# Patient Record
Sex: Female | Born: 1945 | Race: White | Hispanic: No | Marital: Married | Smoking: Former smoker
Health system: Southern US, Community
[De-identification: ages and names within clinical notes are randomized; demographics above are authoritative.]

## PROBLEM LIST (undated history)

## (undated) DIAGNOSIS — I350 Nonrheumatic aortic (valve) stenosis: Secondary | ICD-10-CM

## (undated) DIAGNOSIS — K219 Gastro-esophageal reflux disease without esophagitis: Secondary | ICD-10-CM

## (undated) DIAGNOSIS — H269 Unspecified cataract: Secondary | ICD-10-CM

## (undated) DIAGNOSIS — M1990 Unspecified osteoarthritis, unspecified site: Secondary | ICD-10-CM

## (undated) DIAGNOSIS — R011 Cardiac murmur, unspecified: Secondary | ICD-10-CM

## (undated) DIAGNOSIS — Z923 Personal history of irradiation: Secondary | ICD-10-CM

## (undated) DIAGNOSIS — M549 Dorsalgia, unspecified: Secondary | ICD-10-CM

## (undated) DIAGNOSIS — Z9289 Personal history of other medical treatment: Secondary | ICD-10-CM

## (undated) DIAGNOSIS — F32A Depression, unspecified: Secondary | ICD-10-CM

## (undated) DIAGNOSIS — K5790 Diverticulosis of intestine, part unspecified, without perforation or abscess without bleeding: Secondary | ICD-10-CM

## (undated) DIAGNOSIS — R079 Chest pain, unspecified: Secondary | ICD-10-CM

## (undated) DIAGNOSIS — E669 Obesity, unspecified: Secondary | ICD-10-CM

## (undated) DIAGNOSIS — F329 Major depressive disorder, single episode, unspecified: Secondary | ICD-10-CM

## (undated) DIAGNOSIS — F419 Anxiety disorder, unspecified: Secondary | ICD-10-CM

## (undated) DIAGNOSIS — C55 Malignant neoplasm of uterus, part unspecified: Secondary | ICD-10-CM

## (undated) DIAGNOSIS — R55 Syncope and collapse: Secondary | ICD-10-CM

## (undated) DIAGNOSIS — D649 Anemia, unspecified: Secondary | ICD-10-CM

## (undated) DIAGNOSIS — R9431 Abnormal electrocardiogram [ECG] [EKG]: Secondary | ICD-10-CM

## (undated) HISTORY — DX: Major depressive disorder, single episode, unspecified: F32.9

## (undated) HISTORY — DX: Anemia, unspecified: D64.9

## (undated) HISTORY — DX: Diverticulosis of intestine, part unspecified, without perforation or abscess without bleeding: K57.90

## (undated) HISTORY — DX: Malignant neoplasm of uterus, part unspecified: C55

## (undated) HISTORY — DX: Unspecified osteoarthritis, unspecified site: M19.90

## (undated) HISTORY — DX: Obesity, unspecified: E66.9

## (undated) HISTORY — DX: Personal history of other medical treatment: Z92.89

## (undated) HISTORY — PX: PORT-A-CATH REMOVAL: SHX5289

## (undated) HISTORY — DX: Dorsalgia, unspecified: M54.9

## (undated) HISTORY — DX: Anxiety disorder, unspecified: F41.9

## (undated) HISTORY — DX: Personal history of irradiation: Z92.3

## (undated) HISTORY — DX: Depression, unspecified: F32.A

---

## 2008-08-07 ENCOUNTER — Emergency Department (HOSPITAL_COMMUNITY): Admission: EM | Admit: 2008-08-07 | Discharge: 2008-08-07 | Payer: Self-pay | Admitting: Emergency Medicine

## 2009-11-28 DIAGNOSIS — C55 Malignant neoplasm of uterus, part unspecified: Secondary | ICD-10-CM

## 2009-11-28 HISTORY — DX: Malignant neoplasm of uterus, part unspecified: C55

## 2010-06-09 ENCOUNTER — Ambulatory Visit: Payer: Self-pay | Admitting: Obstetrics & Gynecology

## 2010-06-09 ENCOUNTER — Ambulatory Visit: Payer: Self-pay | Admitting: Interventional Radiology

## 2010-06-09 ENCOUNTER — Ambulatory Visit (HOSPITAL_BASED_OUTPATIENT_CLINIC_OR_DEPARTMENT_OTHER): Admission: RE | Admit: 2010-06-09 | Discharge: 2010-06-09 | Payer: Self-pay | Admitting: Obstetrics & Gynecology

## 2010-06-09 ENCOUNTER — Ambulatory Visit: Payer: Self-pay | Admitting: Diagnostic Radiology

## 2010-06-09 LAB — CONVERTED CEMR LAB
ALT: 11 units/L (ref 0–35)
AST: 11 units/L (ref 0–37)
Creatinine, Ser: 0.78 mg/dL (ref 0.40–1.20)
HCT: 41.5 % (ref 36.0–46.0)
MCHC: 31.1 g/dL (ref 30.0–36.0)
MCV: 95.6 fL (ref 78.0–100.0)
Platelets: 246 10*3/uL (ref 150–400)
RDW: 15.9 % — ABNORMAL HIGH (ref 11.5–15.5)
Total Bilirubin: 0.4 mg/dL (ref 0.3–1.2)

## 2010-06-15 ENCOUNTER — Ambulatory Visit: Payer: Self-pay | Admitting: Family Medicine

## 2010-06-21 ENCOUNTER — Encounter: Admission: RE | Admit: 2010-06-21 | Discharge: 2010-06-21 | Payer: Self-pay | Admitting: Obstetrics & Gynecology

## 2010-06-24 ENCOUNTER — Ambulatory Visit: Payer: Self-pay | Admitting: Family Medicine

## 2010-06-24 DIAGNOSIS — C541 Malignant neoplasm of endometrium: Secondary | ICD-10-CM | POA: Insufficient documentation

## 2010-06-24 DIAGNOSIS — F341 Dysthymic disorder: Secondary | ICD-10-CM

## 2010-07-01 ENCOUNTER — Ambulatory Visit: Admission: RE | Admit: 2010-07-01 | Discharge: 2010-07-01 | Payer: Self-pay | Admitting: Gynecologic Oncology

## 2010-07-27 ENCOUNTER — Ambulatory Visit (HOSPITAL_COMMUNITY): Admission: RE | Admit: 2010-07-27 | Discharge: 2010-07-27 | Payer: Self-pay | Admitting: Obstetrics & Gynecology

## 2010-07-28 ENCOUNTER — Ambulatory Visit: Payer: Self-pay | Admitting: Oncology

## 2010-08-03 ENCOUNTER — Ambulatory Visit (HOSPITAL_COMMUNITY): Admission: RE | Admit: 2010-08-03 | Discharge: 2010-08-03 | Payer: Self-pay | Admitting: Gynecologic Oncology

## 2010-08-05 ENCOUNTER — Ambulatory Visit: Admission: RE | Admit: 2010-08-05 | Discharge: 2010-08-05 | Payer: Self-pay | Admitting: Gynecologic Oncology

## 2010-08-09 ENCOUNTER — Encounter: Payer: Self-pay | Admitting: Family Medicine

## 2010-08-09 LAB — CBC WITH DIFFERENTIAL/PLATELET
BASO%: 0.3 % (ref 0.0–2.0)
HCT: 35.8 % (ref 34.8–46.6)
MCHC: 32.8 g/dL (ref 31.5–36.0)
MONO#: 0.4 10*3/uL (ref 0.1–0.9)
NEUT#: 4.5 10*3/uL (ref 1.5–6.5)
RBC: 4.05 10*6/uL (ref 3.70–5.45)
WBC: 6.6 10*3/uL (ref 3.9–10.3)
lymph#: 1.5 10*3/uL (ref 0.9–3.3)

## 2010-08-09 LAB — COMPREHENSIVE METABOLIC PANEL
ALT: 11 U/L (ref 0–35)
Albumin: 3.9 g/dL (ref 3.5–5.2)
CO2: 25 mEq/L (ref 19–32)
Calcium: 8.7 mg/dL (ref 8.4–10.5)
Chloride: 107 mEq/L (ref 96–112)
Sodium: 142 mEq/L (ref 135–145)
Total Protein: 6.8 g/dL (ref 6.0–8.3)

## 2010-08-09 LAB — CA 125: CA 125: 12.2 U/mL (ref 0.0–30.2)

## 2010-08-12 ENCOUNTER — Ambulatory Visit: Payer: Self-pay | Admitting: Family Medicine

## 2010-08-12 DIAGNOSIS — M545 Low back pain: Secondary | ICD-10-CM

## 2010-08-20 ENCOUNTER — Encounter: Payer: Self-pay | Admitting: Family Medicine

## 2010-08-20 LAB — CBC WITH DIFFERENTIAL/PLATELET
BASO%: 0.4 % (ref 0.0–2.0)
EOS%: 5.3 % (ref 0.0–7.0)
MCH: 29 pg (ref 25.1–34.0)
MCHC: 32.4 g/dL (ref 31.5–36.0)
NEUT%: 52.4 % (ref 38.4–76.8)
RDW: 14.9 % — ABNORMAL HIGH (ref 11.2–14.5)
lymph#: 1.9 10*3/uL (ref 0.9–3.3)

## 2010-08-25 LAB — CBC WITH DIFFERENTIAL/PLATELET
BASO%: 0.6 % (ref 0.0–2.0)
Basophils Absolute: 0 10*3/uL (ref 0.0–0.1)
EOS%: 3.4 % (ref 0.0–7.0)
HGB: 11.3 g/dL — ABNORMAL LOW (ref 11.6–15.9)
MCH: 29.6 pg (ref 25.1–34.0)
NEUT#: 0.7 10*3/uL — ABNORMAL LOW (ref 1.5–6.5)
RDW: 14.8 % — ABNORMAL HIGH (ref 11.2–14.5)
lymph#: 1.5 10*3/uL (ref 0.9–3.3)

## 2010-08-27 ENCOUNTER — Ambulatory Visit: Payer: Self-pay | Admitting: Oncology

## 2010-08-27 LAB — CBC WITH DIFFERENTIAL/PLATELET
Basophils Absolute: 0 10*3/uL (ref 0.0–0.1)
Eosinophils Absolute: 0.2 10*3/uL (ref 0.0–0.5)
HGB: 10.8 g/dL — ABNORMAL LOW (ref 11.6–15.9)
MCV: 89.2 fL (ref 79.5–101.0)
MONO#: 0.8 10*3/uL (ref 0.1–0.9)
NEUT#: 4.2 10*3/uL (ref 1.5–6.5)
RDW: 15.3 % — ABNORMAL HIGH (ref 11.2–14.5)
lymph#: 2.4 10*3/uL (ref 0.9–3.3)

## 2010-08-30 LAB — CBC WITH DIFFERENTIAL/PLATELET
Basophils Absolute: 0 10*3/uL (ref 0.0–0.1)
EOS%: 2.9 % (ref 0.0–7.0)
Eosinophils Absolute: 0.1 10*3/uL (ref 0.0–0.5)
HGB: 11.2 g/dL — ABNORMAL LOW (ref 11.6–15.9)
MCH: 30.3 pg (ref 25.1–34.0)
NEUT#: 1.9 10*3/uL (ref 1.5–6.5)
RBC: 3.7 10*6/uL (ref 3.70–5.45)
RDW: 15.4 % — ABNORMAL HIGH (ref 11.2–14.5)
lymph#: 1.8 10*3/uL (ref 0.9–3.3)

## 2010-08-30 LAB — COMPREHENSIVE METABOLIC PANEL
AST: 12 U/L (ref 0–37)
Albumin: 3.8 g/dL (ref 3.5–5.2)
BUN: 10 mg/dL (ref 6–23)
Calcium: 8.9 mg/dL (ref 8.4–10.5)
Chloride: 106 mEq/L (ref 96–112)
Potassium: 4.4 mEq/L (ref 3.5–5.3)
Sodium: 142 mEq/L (ref 135–145)
Total Protein: 6.3 g/dL (ref 6.0–8.3)

## 2010-08-31 ENCOUNTER — Encounter: Payer: Self-pay | Admitting: Family Medicine

## 2010-09-03 LAB — CBC WITH DIFFERENTIAL/PLATELET
BASO%: 0 % (ref 0.0–2.0)
EOS%: 0 % (ref 0.0–7.0)
HGB: 11.7 g/dL (ref 11.6–15.9)
MCH: 29.3 pg (ref 25.1–34.0)
MCHC: 32.8 g/dL (ref 31.5–36.0)
MONO%: 0.4 % (ref 0.0–14.0)
RBC: 4 10*6/uL (ref 3.70–5.45)
RDW: 15.6 % — ABNORMAL HIGH (ref 11.2–14.5)
lymph#: 1 10*3/uL (ref 0.9–3.3)

## 2010-09-10 ENCOUNTER — Ambulatory Visit: Payer: Self-pay | Admitting: Family Medicine

## 2010-09-10 DIAGNOSIS — G4700 Insomnia, unspecified: Secondary | ICD-10-CM

## 2010-09-10 DIAGNOSIS — K219 Gastro-esophageal reflux disease without esophagitis: Secondary | ICD-10-CM

## 2010-09-13 ENCOUNTER — Telehealth: Payer: Self-pay | Admitting: Family Medicine

## 2010-09-14 ENCOUNTER — Encounter: Payer: Self-pay | Admitting: Family Medicine

## 2010-09-14 LAB — COMPREHENSIVE METABOLIC PANEL
AST: 13 U/L (ref 0–37)
Albumin: 3.6 g/dL (ref 3.5–5.2)
Alkaline Phosphatase: 65 U/L (ref 39–117)
BUN: 11 mg/dL (ref 6–23)
Calcium: 8.7 mg/dL (ref 8.4–10.5)
Chloride: 106 mEq/L (ref 96–112)
Glucose, Bld: 127 mg/dL — ABNORMAL HIGH (ref 70–99)
Potassium: 3.5 mEq/L (ref 3.5–5.3)
Sodium: 141 mEq/L (ref 135–145)
Total Protein: 5.9 g/dL — ABNORMAL LOW (ref 6.0–8.3)

## 2010-09-14 LAB — CBC WITH DIFFERENTIAL/PLATELET
Basophils Absolute: 0 10*3/uL (ref 0.0–0.1)
Eosinophils Absolute: 0 10*3/uL (ref 0.0–0.5)
HGB: 10.3 g/dL — ABNORMAL LOW (ref 11.6–15.9)
NEUT#: 1.1 10*3/uL — ABNORMAL LOW (ref 1.5–6.5)
RBC: 3.39 10*6/uL — ABNORMAL LOW (ref 3.70–5.45)
RDW: 15.8 % — ABNORMAL HIGH (ref 11.2–14.5)
WBC: 2.7 10*3/uL — ABNORMAL LOW (ref 3.9–10.3)
lymph#: 1.3 10*3/uL (ref 0.9–3.3)

## 2010-09-23 LAB — CBC WITH DIFFERENTIAL/PLATELET
Basophils Absolute: 0 10*3/uL (ref 0.0–0.1)
EOS%: 0.9 % (ref 0.0–7.0)
HGB: 10.3 g/dL — ABNORMAL LOW (ref 11.6–15.9)
MCH: 29.7 pg (ref 25.1–34.0)
MCV: 90.5 fL (ref 79.5–101.0)
MONO%: 9.2 % (ref 0.0–14.0)
NEUT#: 1.9 10*3/uL (ref 1.5–6.5)
RBC: 3.47 10*6/uL — ABNORMAL LOW (ref 3.70–5.45)
RDW: 17.7 % — ABNORMAL HIGH (ref 11.2–14.5)
lymph#: 2.2 10*3/uL (ref 0.9–3.3)
nRBC: 0 % (ref 0–0)

## 2010-09-24 ENCOUNTER — Ambulatory Visit: Payer: Self-pay | Admitting: Oncology

## 2010-10-01 ENCOUNTER — Encounter: Payer: Self-pay | Admitting: Family Medicine

## 2010-10-01 LAB — CBC WITH DIFFERENTIAL/PLATELET
BASO%: 0.3 % (ref 0.0–2.0)
Eosinophils Absolute: 0.1 10*3/uL (ref 0.0–0.5)
MONO#: 0.3 10*3/uL (ref 0.1–0.9)
NEUT#: 1 10*3/uL — ABNORMAL LOW (ref 1.5–6.5)
Platelets: 141 10*3/uL — ABNORMAL LOW (ref 145–400)
RBC: 3.42 10*6/uL — ABNORMAL LOW (ref 3.70–5.45)
RDW: 18.8 % — ABNORMAL HIGH (ref 11.2–14.5)
WBC: 3 10*3/uL — ABNORMAL LOW (ref 3.9–10.3)
lymph#: 1.7 10*3/uL (ref 0.9–3.3)

## 2010-10-01 LAB — BASIC METABOLIC PANEL
CO2: 26 mEq/L (ref 19–32)
Chloride: 102 mEq/L (ref 96–112)
Glucose, Bld: 113 mg/dL — ABNORMAL HIGH (ref 70–99)
Potassium: 3.9 mEq/L (ref 3.5–5.3)
Sodium: 137 mEq/L (ref 135–145)

## 2010-10-04 ENCOUNTER — Ambulatory Visit: Payer: Self-pay | Admitting: Family Medicine

## 2010-10-11 ENCOUNTER — Ambulatory Visit (HOSPITAL_COMMUNITY): Admission: RE | Admit: 2010-10-11 | Discharge: 2010-10-11 | Payer: Self-pay | Admitting: Oncology

## 2010-10-13 ENCOUNTER — Ambulatory Visit
Admission: RE | Admit: 2010-10-13 | Discharge: 2010-10-13 | Payer: Self-pay | Source: Home / Self Care | Admitting: Gynecologic Oncology

## 2010-10-13 LAB — CBC WITH DIFFERENTIAL/PLATELET
BASO%: 0.1 % (ref 0.0–2.0)
Basophils Absolute: 0 10*3/uL (ref 0.0–0.1)
EOS%: 0.7 % (ref 0.0–7.0)
HCT: 28 % — ABNORMAL LOW (ref 34.8–46.6)
HGB: 9.6 g/dL — ABNORMAL LOW (ref 11.6–15.9)
LYMPH%: 38.9 % (ref 14.0–49.7)
MCH: 31.7 pg (ref 25.1–34.0)
MCHC: 34.3 g/dL (ref 31.5–36.0)
NEUT%: 47.9 % (ref 38.4–76.8)
Platelets: 116 10*3/uL — ABNORMAL LOW (ref 145–400)

## 2010-10-13 LAB — COMPREHENSIVE METABOLIC PANEL
ALT: 12 U/L (ref 0–35)
BUN: 12 mg/dL (ref 6–23)
CO2: 25 mEq/L (ref 19–32)
Calcium: 8.6 mg/dL (ref 8.4–10.5)
Chloride: 105 mEq/L (ref 96–112)
Creatinine, Ser: 0.88 mg/dL (ref 0.40–1.20)
Total Bilirubin: 0.4 mg/dL (ref 0.3–1.2)

## 2010-10-15 ENCOUNTER — Telehealth: Payer: Self-pay | Admitting: Family Medicine

## 2010-10-28 HISTORY — PX: TOTAL ABDOMINAL HYSTERECTOMY: SHX209

## 2010-11-02 ENCOUNTER — Encounter: Payer: Self-pay | Admitting: Obstetrics & Gynecology

## 2010-11-02 ENCOUNTER — Ambulatory Visit (HOSPITAL_COMMUNITY)
Admission: RE | Admit: 2010-11-02 | Discharge: 2010-11-03 | Payer: Self-pay | Source: Home / Self Care | Attending: Obstetrics & Gynecology | Admitting: Obstetrics & Gynecology

## 2010-11-11 ENCOUNTER — Ambulatory Visit
Admission: RE | Admit: 2010-11-11 | Discharge: 2010-11-11 | Payer: Self-pay | Source: Home / Self Care | Attending: Gynecologic Oncology | Admitting: Gynecologic Oncology

## 2010-11-11 ENCOUNTER — Telehealth: Payer: Self-pay | Admitting: Family Medicine

## 2010-11-16 ENCOUNTER — Ambulatory Visit: Payer: Self-pay | Admitting: Oncology

## 2010-11-26 ENCOUNTER — Encounter: Payer: Self-pay | Admitting: Family Medicine

## 2010-11-26 LAB — CBC WITH DIFFERENTIAL/PLATELET
Basophils Absolute: 0 10*3/uL (ref 0.0–0.1)
Eosinophils Absolute: 0.1 10*3/uL (ref 0.0–0.5)
HCT: 32 % — ABNORMAL LOW (ref 34.8–46.6)
HGB: 10.6 g/dL — ABNORMAL LOW (ref 11.6–15.9)
LYMPH%: 34.9 % (ref 14.0–49.7)
MCV: 99.4 fL (ref 79.5–101.0)
MONO%: 6.1 % (ref 0.0–14.0)
NEUT#: 3.1 10*3/uL (ref 1.5–6.5)
Platelets: 200 10*3/uL (ref 145–400)

## 2010-11-26 LAB — COMPREHENSIVE METABOLIC PANEL
Albumin: 4.2 g/dL (ref 3.5–5.2)
Alkaline Phosphatase: 78 U/L (ref 39–117)
BUN: 17 mg/dL (ref 6–23)
CO2: 24 mEq/L (ref 19–32)
Glucose, Bld: 126 mg/dL — ABNORMAL HIGH (ref 70–99)
Total Bilirubin: 0.4 mg/dL (ref 0.3–1.2)

## 2010-11-28 HISTORY — PX: PORTACATH PLACEMENT: SHX2246

## 2010-12-06 ENCOUNTER — Ambulatory Visit (HOSPITAL_COMMUNITY)
Admission: RE | Admit: 2010-12-06 | Discharge: 2010-12-06 | Payer: Self-pay | Source: Home / Self Care | Attending: Oncology | Admitting: Oncology

## 2010-12-09 ENCOUNTER — Encounter: Payer: Self-pay | Admitting: Family Medicine

## 2010-12-09 ENCOUNTER — Ambulatory Visit
Admission: RE | Admit: 2010-12-09 | Discharge: 2010-12-09 | Payer: Self-pay | Source: Home / Self Care | Attending: Family Medicine | Admitting: Family Medicine

## 2010-12-22 ENCOUNTER — Ambulatory Visit (HOSPITAL_BASED_OUTPATIENT_CLINIC_OR_DEPARTMENT_OTHER): Payer: Medicare PPO | Admitting: Oncology

## 2010-12-24 ENCOUNTER — Encounter: Payer: Self-pay | Admitting: Family Medicine

## 2010-12-24 LAB — CBC WITH DIFFERENTIAL/PLATELET
Basophils Absolute: 0 10*3/uL (ref 0.0–0.1)
EOS%: 2.8 % (ref 0.0–7.0)
HGB: 9.8 g/dL — ABNORMAL LOW (ref 11.6–15.9)
MCH: 32.2 pg (ref 25.1–34.0)
MCV: 100 fL (ref 79.5–101.0)
MONO%: 5.8 % (ref 0.0–14.0)
NEUT%: 38.5 % (ref 38.4–76.8)
RDW: 14.5 % (ref 11.2–14.5)

## 2010-12-28 NOTE — Letter (Signed)
Summary: Depression Questionnaire  Depression Questionnaire   Imported By: Lanelle Bal 09/17/2010 16:25:11  _____________________________________________________________________  External Attachment:    Type:   Image     Comment:   External Document

## 2010-12-28 NOTE — Progress Notes (Signed)
Summary: meds   Phone Note Call from Patient   Caller: Patient Call For: Nani Gasser MD Summary of Call: pt called and states aciphex was $288.Pt states she has samples to last her untill the first of Nov. Did you want to write another rx or wait until the first of Nov because pt has another appt with you then Initial call taken by: Avon Gully CMA, Duncan Dull),  September 13, 2010 8:30 AM  Follow-up for Phone Call        Let just wait until next appt. Might have more samples I can give her by then or can talk about other options.  Follow-up by: Nani Gasser MD,  September 13, 2010 9:09 AM  Additional Follow-up for Phone Call Additional follow up Details #1::        called and left above info on pt's vm Additional Follow-up by: Avon Gully CMA, Duncan Dull),  September 13, 2010 10:59 AM

## 2010-12-28 NOTE — Letter (Signed)
Summary: Depression Questionnaire  Depression Questionnaire   Imported By: Lanelle Bal 08/25/2010 13:54:41  _____________________________________________________________________  External Attachment:    Type:   Image     Comment:   External Document

## 2010-12-28 NOTE — Letter (Signed)
Summary: [facility]  [facility]   Imported By: Lanelle Bal 10/25/2010 08:39:00  _____________________________________________________________________  External Attachment:    Type:   Image     Comment:   External Document

## 2010-12-28 NOTE — Letter (Signed)
Summary: [facility]  [facility]   Imported By: Sherian Rein 09/28/2010 15:12:59  _____________________________________________________________________  External Attachment:    Type:   Image     Comment:   External Document

## 2010-12-28 NOTE — Letter (Signed)
Summary: Depression Questionnaire  Depression Questionnaire   Imported By: Lanelle Bal 07/23/2010 12:03:21  _____________________________________________________________________  External Attachment:    Type:   Image     Comment:   External Document

## 2010-12-28 NOTE — Letter (Signed)
Summary: Depression Questionnaire  Depression Questionnaire   Imported By: Lanelle Bal 10/09/2010 11:07:45  _____________________________________________________________________  External Attachment:    Type:   Image     Comment:   External Document

## 2010-12-28 NOTE — Assessment & Plan Note (Signed)
Summary: 1 MONTH FU Mood, sleep, GERD   Vital Signs:  Patient profile:   65 year old female Height:      63.5 inches Weight:      269 pounds Pulse rate:   82 / minute BP sitting:   128 / 74  (right arm) Cuff size:   large  Vitals Entered By: Avon Gully CMA, Duncan Dull) (September 10, 2010 9:32 AM) CC: f/u mood,not sleeping well at night   CC:  f/u mood and not sleeping well at night.  History of Present Illness: f/u mood,not sleeping well at night. Only getting about 3 hours of sleep most nights. then the next day can't concentrate and feels achey all over.  Has been tearful daily.  Says her memory is not at good. She is on chemo for uterine cancer and has lost some weight but she is very excited about the weight loss. Her husband is really not very supportive of her.   Says the Aciphex Dr. Darrold Span gave her has really helped her reflux and nausea.    Current Medications (verified): 1)  Lorazepam 1 Mg Tabs (Lorazepam) .... Take 1/2 -1 Tanlet A Day As Needed 2)  Shower Chair .... Dx: Uterine Cancer, Lower Extremity Weakness, Low Back Pain 3)  Lift Chair .... Dx: Uterine Cancer, Lower Extremity Weakness, Low Back Pain. 4)  Prochlorperazine Maleate 10 Mg Tabs (Prochlorperazine Maleate) .... One Tablet By Mouht Every 4-6 Hours 5)  Oxycodone-Acetaminophen 5-325 Mg Tabs (Oxycodone-Acetaminophen) .... One Tablet By Mouth Every Six Hours 6)  Ondansetron 8 Mg Tbdp (Ondansetron) .... One Tablet By Mouth Every 24 Hours 7)  Dexamethasone 4 Mg Tabs (Dexamethasone) .... 5 Tablet 12 Hours Befor Chemo 8)  Senokot  S 1 Daily 9)  Multivitamins  Caps (Multiple Vitamin) .... Take One Tablet By Mouth Twice A Day 10)  Aciphex 20 Mg Tbec (Rabeprazole Sodium) .... One Tablet By Mouth Daily  Allergies (verified): No Known Drug Allergies  Comments:  Nurse/Medical Assistant: The patient's medications and allergies were reviewed with the patient and were updated in the Medication and Allergy  Lists. Avon Gully CMA, Duncan Dull) (September 10, 2010 9:38 AM)  Physical Exam  General:  Well-developed,well-nourished,in no acute distress; alert,appropriate and cooperative throughout examination Head:  Normocephalic and atraumatic without obvious abnormalities. No apparent alopecia or balding. Eyes:  No corneal or conjunctival inflammation noted. EOMI. Perrla.  Lungs:  Normal respiratory effort, chest expands symmetrically. Lungs are clear to auscultation, no crackles or wheezes. Heart:  Normal rate and regular rhythm. S1 and S2 normal without gallop, murmur, click, rub or other extra sounds.   Impression & Recommendations:  Problem # 1:  DEPRESSION, SITUATIONAL, ACUTE (ICD-300.4) Assessment Deteriorated Will start the fluoxetine dialy for better control. Can use the benzo with it as needed.  PHQ-9 score of 15 today. Tamera Punt not well controlled. She didnt do well on teh citalopram. Says she didn't liek how it made her feels but not able to give me a specific reason.  F/U in 3 weeks.   Problem # 2:  INSOMNIA (ICD-780.52) Assessment: New Discussed dx. Will start with Ambien and discussed potential SE like sedation etc. Call if not helping after one week.  Her updated medication list for this problem includes:    Ambien 10 Mg Tabs (Zolpidem tartrate) .Marland Kitchen... Take 1 tablet by mouth once a day at bedtime  Problem # 3:  GERD (ICD-530.81) I am happy this is work ing really well for her.  Rx sent. If denies may  have to try another PPI or if too expensive but she will let me know.  Her updated medication list for this problem includes:    Aciphex 20 Mg Tbec (Rabeprazole sodium) ..... One tablet by mouth daily  Complete Medication List: 1)  Lorazepam 1 Mg Tabs (Lorazepam) .... Take 1/2 -1 tanlet a day as needed 2)  Paediatric nurse  .... Dx: uterine cancer, lower extremity weakness, low back pain 3)  Lift Chair  .... Dx: uterine cancer, lower extremity weakness, low back pain. 4)   Prochlorperazine Maleate 10 Mg Tabs (Prochlorperazine maleate) .... One tablet by mouht every 4-6 hours 5)  Oxycodone-acetaminophen 5-325 Mg Tabs (Oxycodone-acetaminophen) .... One tablet by mouth every six hours 6)  Ondansetron 8 Mg Tbdp (Ondansetron) .... One tablet by mouth every 24 hours 7)  Dexamethasone 4 Mg Tabs (Dexamethasone) .... 5 tablet 12 hours befor chemo 8)  Senokot S 1 Daily  9)  Multivitamins Caps (Multiple vitamin) .... Take one tablet by mouth twice a day 10)  Aciphex 20 Mg Tbec (Rabeprazole sodium) .... One tablet by mouth daily 11)  Fluoxetine Hcl 20 Mg Tabs (Fluoxetine hcl) .... Start with half a tab once a day, then increase tow whole tab. 12)  Ambien 10 Mg Tabs (Zolpidem tartrate) .... Take 1 tablet by mouth once a day at bedtime  Patient Instructions: 1)  Please schedule a follow-up appointment in 3 weeks for mood and sleep. 2)  Start with half a tab of the ambien and increase to whole if needed 3)  Start half a tab of the fluoxetine.   Prescriptions: AMBIEN 10 MG TABS (ZOLPIDEM TARTRATE) Take 1 tablet by mouth once a day at bedtime  #30 x 0   Entered and Authorized by:   Nani Gasser MD   Signed by:   Nani Gasser MD on 09/10/2010   Method used:   Printed then faxed to ...       [address] [phone]* (retail)       [address] [place], [place]  [address]       Ph: [phone]       Fax: [phone]   RxID:   [id number] ACIPHEX 20 MG TBEC (RABEPRAZOLE SODIUM) one tablet by mouth daily  #30 x 2   Entered and Authorized by:   Nani Gasser MD   Signed by:   Nani Gasser MD on 09/10/2010   Method used:   Electronically to        [facility] [phone]* (retail)       [address] [place], [place]  [address]       Ph: [phone]       Fax: [phone]   RxID:   [id number] FLUOXETINE HCL 20 MG TABS (FLUOXETINE HCL) Start with half a tab once a day, then increase tow whole tab.  #30 x 1   Entered and Authorized by:    Nani Gasser MD   Signed by:   Nani Gasser MD on 09/10/2010   Method used:   Electronically to        [facility] [phone]* (retail)       [address] [place], [place]  [address]       Ph: [phone]       Fax: [phone]   RxID:   [id number]

## 2010-12-28 NOTE — Progress Notes (Signed)
Summary: FYI- surgery date  Phone Note Call from Patient Call back at Home Phone [phone]   Caller: Patient Call For: Nani Gasser MD Summary of Call: Pt calls and states that you wanted to know when Dr. Gerre Pebbles would be doing her surgery- it will be done on December 6th Initial call taken by: Kathlene November LPN,  October 15, 2010 9:09 AM

## 2010-12-28 NOTE — Assessment & Plan Note (Signed)
Summary: 3 week f/u depression   Vital Signs:  Patient profile:   65 year old female Height:      63.5 inches Weight:      266 pounds Pulse rate:   86 / minute BP sitting:   132 / 80  (right arm) Cuff size:   regular  Vitals Entered By: Avon Gully CMA, Duncan Dull) (October 04, 2010 8:55 AM) CC: f/u mood,pt feels better   CC:  f/u mood and pt feels better.  History of Present Illness: f/u mood,pt feels better. When went  up to a whole tab she noticed a inc in fatigue. Has been using her Ambien and says it has really helped her sleep. No Side effects.  No other SE of the fluoxetine.Says she realy feels it is helping and has improved her motivation. Has now finshed her chemo.  Has her CT scan in about 2 weeks and then has f/u with her surgeon. Has lost 18lb from the chemo and working with the nutritionist.    Allergies: No Known Drug Allergies  Physical Exam  General:  Well-developed,well-nourished,in no acute distress; alert,appropriate and cooperative throughout examination Lungs:  Normal respiratory effort, chest expands symmetrically. Lungs are clear to auscultation, no crackles or wheezes. Heart:  Normal rate and regular rhythm. S1 and S2 normal without gallop, murmur, click, rub or other extra sounds.   Impression & Recommendations:  Problem # 1:  DEPRESSION, SITUATIONAL, ACUTE (ICD-300.4) PHQ-9 score is 5.  Much improved form 15.  Tolerating well. Try moving to bedtime and see if that helps with th esedaion of the medication. F/U in 2 months. She is doing a great job with her diet.    Complete Medication List: 1)  Lorazepam 1 Mg Tabs (Lorazepam) .... Take 1/2 -1 tanlet a day as needed 2)  Paediatric nurse  .... Dx: uterine cancer, lower extremity weakness, low back pain 3)  Lift Chair  .... Dx: uterine cancer, lower extremity weakness, low back pain. 4)  Prochlorperazine Maleate 10 Mg Tabs (Prochlorperazine maleate) .... One tablet by mouht every 4-6 hours 5)   Oxycodone-acetaminophen 5-325 Mg Tabs (Oxycodone-acetaminophen) .... One tablet by mouth every six hours 6)  Ondansetron 8 Mg Tbdp (Ondansetron) .... One tablet by mouth every 24 hours 7)  Dexamethasone 4 Mg Tabs (Dexamethasone) .... 5 tablet 12 hours befor chemo 8)  Senokot S 1 Daily  9)  Multivitamins Caps (Multiple vitamin) .... Take one tablet by mouth twice a day 10)  Aciphex 20 Mg Tbec (Rabeprazole sodium) .... One tablet by mouth daily 11)  Fluoxetine Hcl 20 Mg Tabs (Fluoxetine hcl) .... Start with half a tab once a day, then increase tow whole tab. 12)  Ambien 10 Mg Tabs (Zolpidem tartrate) .... Take 1 tablet by mouth once a day at bedtime  Patient Instructions: 1)  Please schedule a follow-up appointment in 2 months for mood.  2)  Move the fluoxetine to bedtime for 4-5 days. If fatigue still not better can cut the pill in half.    Orders Added: 1)  Est. Patient Level III [[address]]

## 2010-12-28 NOTE — Letter (Signed)
Summary: [facility]  [facility]   Imported By: Lanelle Bal 09/02/2010 10:53:50  _____________________________________________________________________  External Attachment:    Type:   Image     Comment:   External Document

## 2010-12-28 NOTE — Letter (Signed)
[facility]  [facility]   Imported By: Lanelle Bal 10/01/2010 12:20:00  _____________________________________________________________________  External Attachment:    Type:   Image     Comment:   External Document

## 2010-12-28 NOTE — Assessment & Plan Note (Signed)
Summary: 1 mo. f/u depression   Vital Signs:  Patient profile:   65 year old female Height:      63.5 inches Weight:      282 pounds Pulse rate:   70 / minute BP sitting:   124 / 71  (right arm) Cuff size:   large  Vitals Entered By: Avon Gully CMA, Duncan Dull) (August 12, 2010 1:52 PM) CC: f/u depression   CC:  f/u depression.  History of Present Illness: Had PET scan that showed in the uterus but not metastasized.  Sart chemo tomorrow. Starting taxol.  Says her mood is fair. Has some bad days. Stopped the citalopram because mad her feel "weird". Not able to pinpoint anything specific. Using the xanx three times a day. Feels her husband is not being supportive. She does have a neighbor who is going to appts with her and this has been helpful.   Current Medications (verified): 1)  Lorazepam 1 Mg Tabs (Lorazepam) .... Take 1/2 -1 Tanlet A Day As Needed  Allergies (verified): No Known Drug Allergies  Comments:  Nurse/Medical Assistant: The patient's medications and allergies were reviewed with the patient and were updated in the Medication and Allergy Lists. Avon Gully CMA, Duncan Dull) (August 12, 2010 1:53 PM)  Physical Exam  General:  Well-developed,well-nourished,in no acute distress; alert,appropriate and cooperative throughout examination Lungs:  Normal respiratory effort, chest expands symmetrically. Lungs are clear to auscultation, no crackles or wheezes. Heart:  Normal rate and regular rhythm. S1 and S2 normal without gallop, murmur, click, rub or other extra sounds. Psych:  Oriented X3, memory intact for recent and remote, and normallyinteractive. Seems happpier today than last visit.    Impression & Recommendations:  Problem # 1:  DEPRESSION, SITUATIONAL, ACUTE (ICD-300.4) I really thinks she is coming to terms with he dx and is doing some better. I regret that her husband is not more supportive of her. I still recommen cousneling. PHQ - 9 score today is 4  (down from 13). She is not interested in counseling because of what her husband feels about it.  Has lost 5 lbs. Will continue the xanax but if needig more than 2 tabs daily then will need to try a second SSRI since didn't like the citalopram. She doesn't want to trya another SSRI at this point. F/U in 2 months.   Complete Medication List: 1)  Lorazepam 1 Mg Tabs (Lorazepam) .... Take 1/2 -1 tanlet a day as needed 2)  Paediatric nurse  .... Dx: uterine cancer, lower extremity weakness, low back pain 3)  Lift Chair  .... Dx: uterine cancer, lower extremity weakness, low back pain. Prescriptions: LIFT CHAIR Dx: Uterine cancer, lower extremity weakness, low back pain.  #1 x 0   Entered and Authorized by:   Nani Gasser MD   Signed by:   Nani Gasser MD on 08/12/2010   Method used:   Print then Give to Patient   RxID:   [id number] JYNWGN CHAIR Dx: Uterine cancer, lower extremity weakness, low back pain  #1 x 0   Entered and Authorized by:   Nani Gasser MD   Signed by:   Nani Gasser MD on 08/12/2010   Method used:   Print then Give to Patient   RxID:   [id number]
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
## 2010-12-28 NOTE — Letter (Signed)
Summary: [facility]  [facility]   Imported By: Lanelle Bal 09/10/2010 11:52:24  _____________________________________________________________________  External Attachment:    Type:   Image     Comment:   External Document

## 2010-12-30 NOTE — Assessment & Plan Note (Signed)
Summary: 2 mo f/u on mood   Vital Signs:  Patient profile:   65 year old female Height:      63.5 inches Weight:      261 pounds Pulse rate:   112 / minute BP sitting:   103 / 62  (right arm) Cuff size:   regular  Vitals Entered By: Avon Gully CMA, Duncan Dull) (December 09, 2010 9:26 AM) CC: f/u depression   CC:  f/u depression.  History of Present Illness: Did well with her surgery adn chemotherapy. Told the ovaries and tubes has nod sign of cancer.  They are planning on more radiation and chemotherapy.  Says overall she has done well.  still having difficlyt dealing with her husband. She feels that is more stressful than the cancer.  No SE from teh medication.   Current Medications (verified): 1)  Paediatric nurse .... Dx: Uterine Cancer, Lower Extremity Weakness, Low Back Pain 2)  Lift Chair .... Dx: Uterine Cancer, Lower Extremity Weakness, Low Back Pain. 3)  Prochlorperazine Maleate 10 Mg Tabs (Prochlorperazine Maleate) .... One Tablet By Mouht Every 4-6 Hours 4)  Oxycodone-Acetaminophen 5-325 Mg Tabs (Oxycodone-Acetaminophen) .... One Tablet By Mouth Every Six Hours 5)  Ondansetron 8 Mg Tbdp (Ondansetron) .... One Tablet By Mouth Every 24 Hours 6)  Dexamethasone 4 Mg Tabs (Dexamethasone) .... 5 Tablet 12 Hours Befor Chemo 7)  Senokot  S 1 Daily 8)  Multivitamins  Caps (Multiple Vitamin) .... Take One Tablet By Mouth Twice A Day 9)  Aciphex 20 Mg Tbec (Rabeprazole Sodium) .... One Tablet By Mouth Daily 10)  Fluoxetine Hcl 20 Mg Tabs (Fluoxetine Hcl) .... Start With Half A Tab Once A Day, Then Increase Tow Whole Tab. 11)  Ambien 10 Mg Tabs (Zolpidem Tartrate) .... Take 1 Tablet By Mouth Once A Day At Bedtime 12)  Ferrous Gluconate 324 (38 Fe) Mg Tabs (Ferrous Gluconate) .... Take One Tablet By Mouth Daily  Allergies (verified): No Known Drug Allergies  Comments:  Nurse/Medical Assistant: The patient's medications and allergies were reviewed with the patient and were updated  in the Medication and Allergy Lists. Avon Gully CMA, Duncan Dull) (December 09, 2010 9:27 AM)  Past History:  Past Surgical History: Complete hysterectomcy  Portacath.   Physical Exam  General:  Well-developed,well-nourished,in no acute distress; alert,appropriate and cooperative throughout examination Lungs:  Normal respiratory effort, chest expands symmetrically. Lungs are clear to auscultation, no crackles or wheezes. Heart:  Normal rate and regular rhythm. S1 and S2 normal without gallop, murmur, click, rub or other extra sounds. Psych:  Cognition and judgment appear intact. Alert and cooperative with normal attention span and concentration. No apparent delusions, illusions, hallucinations   Impression & Recommendations:  Problem # 1:  DEPRESSION, SITUATIONAL, ACUTE (ICD-300.4) Her mood is really well controlled. PHQ-9 score of 0. Will refill for 90 days.  F/U in 3- 4 months.   Complete Medication List: 1)  Paediatric nurse  .... Dx: uterine cancer, lower extremity weakness, low back pain 2)  Lift Chair  .... Dx: uterine cancer, lower extremity weakness, low back pain. 3)  Prochlorperazine Maleate 10 Mg Tabs (Prochlorperazine maleate) .... One tablet by mouht every 4-6 hours 4)  Oxycodone-acetaminophen 5-325 Mg Tabs (Oxycodone-acetaminophen) .... One tablet by mouth every six hours 5)  Ondansetron 8 Mg Tbdp (Ondansetron) .... One tablet by mouth every 24 hours 6)  Dexamethasone 4 Mg Tabs (Dexamethasone) .... 5 tablet 12 hours befor chemo 7)  Senokot S 1 Daily  8)  Multivitamins Caps (Multiple  vitamin) .... Take one tablet by mouth twice a day 9)  Aciphex 20 Mg Tbec (Rabeprazole sodium) .... One tablet by mouth daily 10)  Fluoxetine Hcl 20 Mg Tabs (Fluoxetine hcl) .... Take 1 tablet by mouth once a day 11)  Ambien 10 Mg Tabs (Zolpidem tartrate) .... Take 1 tablet by mouth once a day at bedtime 12)  Ferrous Gluconate 324 (38 Fe) Mg Tabs (Ferrous gluconate) .... Take one tablet by  mouth daily  Patient Instructions: 1)  Please schedule a follow-up appointment in 3-4 months for mood.  Prescriptions: FLUOXETINE HCL 20 MG TABS (FLUOXETINE HCL) Take 1 tablet by mouth once a day  #90 x 1   Entered and Authorized by:   Nani Gasser MD   Signed by:   Nani Gasser MD on 12/09/2010   Method used:   Electronically to        [facility] [phone]* (retail)       [address] [place], [place]  [address]       Ph: [phone]       Fax: [phone]   RxID:   [id number]    Orders Added: 1)  Est. Patient Level II [36644]   Immunization History:  Influenza Immunization History:    Influenza:  historical (11/30/2010)   Immunization History:  Influenza Immunization History:    Influenza:  Historical (11/30/2010)   Immunization History:  Influenza Immunization History:    Influenza:  historical (11/30/2010)

## 2010-12-30 NOTE — Progress Notes (Signed)
Summary: Check on pt.   ---- Converted from flag ---- ---- 11/10/2010 4:11 PM, Avon Gully CMA, (AAMA) wrote: 11/10/10 acm 4:11 ovaries ,tubes ,uteris cervix and one lymph node. Pt goes back tomorrow for check up.pt is having some difficulty moving around but is feeling fine under the circumstances  ---- 10/15/2010 9:32 AM, Nani Gasser MD wrote: Will you call and see how she dide with her surgery. ------------------------------

## 2010-12-30 NOTE — Letter (Signed)
Summary: [facility]  [facility]   Imported By: Lanelle Bal 12/23/2010 10:35:59  _____________________________________________________________________  External Attachment:    Type:   Image     Comment:   External Document

## 2010-12-30 NOTE — Letter (Signed)
Summary: Depression Questionnaire  Depression Questionnaire   Imported By: Lanelle Bal 12/22/2010 11:49:06  _____________________________________________________________________  External Attachment:    Type:   Image     Comment:   External Document

## 2010-12-31 ENCOUNTER — Encounter (HOSPITAL_BASED_OUTPATIENT_CLINIC_OR_DEPARTMENT_OTHER): Payer: Medicare PPO | Admitting: Oncology

## 2010-12-31 ENCOUNTER — Encounter: Payer: Self-pay | Admitting: Family Medicine

## 2010-12-31 DIAGNOSIS — C549 Malignant neoplasm of corpus uteri, unspecified: Secondary | ICD-10-CM

## 2010-12-31 DIAGNOSIS — Z23 Encounter for immunization: Secondary | ICD-10-CM

## 2010-12-31 DIAGNOSIS — Z5111 Encounter for antineoplastic chemotherapy: Secondary | ICD-10-CM

## 2010-12-31 DIAGNOSIS — Z5189 Encounter for other specified aftercare: Secondary | ICD-10-CM

## 2010-12-31 LAB — CBC WITH DIFFERENTIAL/PLATELET
BASO%: 0.3 % (ref 0.0–2.0)
Eosinophils Absolute: 0 10*3/uL (ref 0.0–0.5)
MCHC: 34 g/dL (ref 31.5–36.0)
MONO#: 0 10*3/uL — ABNORMAL LOW (ref 0.1–0.9)
NEUT#: 2.5 10*3/uL (ref 1.5–6.5)
RBC: 3.22 10*6/uL — ABNORMAL LOW (ref 3.70–5.45)
RDW: 15.1 % — ABNORMAL HIGH (ref 11.2–14.5)
WBC: 3.3 10*3/uL — ABNORMAL LOW (ref 3.9–10.3)
lymph#: 0.7 10*3/uL — ABNORMAL LOW (ref 0.9–3.3)

## 2011-01-01 ENCOUNTER — Encounter (HOSPITAL_BASED_OUTPATIENT_CLINIC_OR_DEPARTMENT_OTHER): Payer: Medicare PPO | Admitting: Oncology

## 2011-01-02 ENCOUNTER — Encounter: Payer: Medicare PPO | Admitting: Oncology

## 2011-01-02 DIAGNOSIS — C549 Malignant neoplasm of corpus uteri, unspecified: Secondary | ICD-10-CM

## 2011-01-03 ENCOUNTER — Encounter (HOSPITAL_BASED_OUTPATIENT_CLINIC_OR_DEPARTMENT_OTHER): Payer: Medicare PPO | Admitting: Oncology

## 2011-01-03 DIAGNOSIS — C549 Malignant neoplasm of corpus uteri, unspecified: Secondary | ICD-10-CM

## 2011-01-04 ENCOUNTER — Encounter (HOSPITAL_BASED_OUTPATIENT_CLINIC_OR_DEPARTMENT_OTHER): Payer: Medicare PPO | Admitting: Oncology

## 2011-01-04 DIAGNOSIS — C549 Malignant neoplasm of corpus uteri, unspecified: Secondary | ICD-10-CM

## 2011-01-17 ENCOUNTER — Encounter (HOSPITAL_BASED_OUTPATIENT_CLINIC_OR_DEPARTMENT_OTHER): Payer: Medicare PPO | Admitting: Oncology

## 2011-01-17 ENCOUNTER — Other Ambulatory Visit: Payer: Self-pay | Admitting: Oncology

## 2011-01-17 ENCOUNTER — Encounter: Payer: Self-pay | Admitting: Family Medicine

## 2011-01-17 DIAGNOSIS — Z5111 Encounter for antineoplastic chemotherapy: Secondary | ICD-10-CM

## 2011-01-17 DIAGNOSIS — C549 Malignant neoplasm of corpus uteri, unspecified: Secondary | ICD-10-CM

## 2011-01-17 DIAGNOSIS — Z5189 Encounter for other specified aftercare: Secondary | ICD-10-CM

## 2011-01-17 DIAGNOSIS — Z23 Encounter for immunization: Secondary | ICD-10-CM

## 2011-01-17 LAB — COMPREHENSIVE METABOLIC PANEL
ALT: 11 U/L (ref 0–35)
CO2: 28 mEq/L (ref 19–32)
Calcium: 8.8 mg/dL (ref 8.4–10.5)
Chloride: 105 mEq/L (ref 96–112)
Creatinine, Ser: 0.81 mg/dL (ref 0.40–1.20)
Glucose, Bld: 92 mg/dL (ref 70–99)
Total Protein: 6.5 g/dL (ref 6.0–8.3)

## 2011-01-17 LAB — CBC WITH DIFFERENTIAL/PLATELET
BASO%: 0.3 % (ref 0.0–2.0)
Eosinophils Absolute: 0.1 10*3/uL (ref 0.0–0.5)
HCT: 27.9 % — ABNORMAL LOW (ref 34.8–46.6)
HGB: 9.6 g/dL — ABNORMAL LOW (ref 11.6–15.9)
MCHC: 34.3 g/dL (ref 31.5–36.0)
MONO#: 0.4 10*3/uL (ref 0.1–0.9)
NEUT#: 2.1 10*3/uL (ref 1.5–6.5)
NEUT%: 47.1 % (ref 38.4–76.8)
Platelets: 87 10*3/uL — ABNORMAL LOW (ref 145–400)
WBC: 4.4 10*3/uL (ref 3.9–10.3)
lymph#: 1.8 10*3/uL (ref 0.9–3.3)

## 2011-01-25 NOTE — Letter (Signed)
Summary: [facility]  [facility]   Imported By: Maryln Gottron 01/17/2011 12:50:38  _____________________________________________________________________  External Attachment:    Type:   Image     Comment:   External Document

## 2011-01-26 ENCOUNTER — Encounter (HOSPITAL_BASED_OUTPATIENT_CLINIC_OR_DEPARTMENT_OTHER): Payer: Medicare PPO | Admitting: Oncology

## 2011-01-26 ENCOUNTER — Other Ambulatory Visit: Payer: Self-pay | Admitting: Oncology

## 2011-01-26 DIAGNOSIS — C549 Malignant neoplasm of corpus uteri, unspecified: Secondary | ICD-10-CM

## 2011-01-26 DIAGNOSIS — Z5111 Encounter for antineoplastic chemotherapy: Secondary | ICD-10-CM

## 2011-01-26 LAB — CBC WITH DIFFERENTIAL/PLATELET
Basophils Absolute: 0 10*3/uL (ref 0.0–0.1)
Eosinophils Absolute: 0.1 10*3/uL (ref 0.0–0.5)
HCT: 28.3 % — ABNORMAL LOW (ref 34.8–46.6)
HGB: 9.1 g/dL — ABNORMAL LOW (ref 11.6–15.9)
LYMPH%: 32.5 % (ref 14.0–49.7)
MCHC: 32.2 g/dL (ref 31.5–36.0)
MONO#: 0.5 10*3/uL (ref 0.1–0.9)
NEUT%: 57.9 % (ref 38.4–76.8)
Platelets: 117 10*3/uL — ABNORMAL LOW (ref 145–400)
WBC: 6.7 10*3/uL (ref 3.9–10.3)
lymph#: 2.2 10*3/uL (ref 0.9–3.3)

## 2011-01-27 ENCOUNTER — Encounter: Payer: Self-pay | Admitting: Family Medicine

## 2011-01-28 ENCOUNTER — Encounter (HOSPITAL_BASED_OUTPATIENT_CLINIC_OR_DEPARTMENT_OTHER): Payer: Medicare PPO | Admitting: Oncology

## 2011-01-28 DIAGNOSIS — Z5111 Encounter for antineoplastic chemotherapy: Secondary | ICD-10-CM

## 2011-01-29 ENCOUNTER — Encounter (HOSPITAL_BASED_OUTPATIENT_CLINIC_OR_DEPARTMENT_OTHER): Payer: Medicare PPO | Admitting: Oncology

## 2011-01-29 DIAGNOSIS — C549 Malignant neoplasm of corpus uteri, unspecified: Secondary | ICD-10-CM

## 2011-01-30 ENCOUNTER — Encounter: Payer: Medicare PPO | Admitting: Oncology

## 2011-01-30 DIAGNOSIS — C549 Malignant neoplasm of corpus uteri, unspecified: Secondary | ICD-10-CM

## 2011-01-31 ENCOUNTER — Encounter (HOSPITAL_BASED_OUTPATIENT_CLINIC_OR_DEPARTMENT_OTHER): Payer: Medicare PPO | Admitting: Oncology

## 2011-01-31 DIAGNOSIS — C549 Malignant neoplasm of corpus uteri, unspecified: Secondary | ICD-10-CM

## 2011-02-01 ENCOUNTER — Encounter (HOSPITAL_BASED_OUTPATIENT_CLINIC_OR_DEPARTMENT_OTHER): Payer: Medicare PPO | Admitting: Oncology

## 2011-02-01 DIAGNOSIS — C549 Malignant neoplasm of corpus uteri, unspecified: Secondary | ICD-10-CM

## 2011-02-07 ENCOUNTER — Encounter: Payer: Self-pay | Admitting: Family Medicine

## 2011-02-07 ENCOUNTER — Ambulatory Visit (INDEPENDENT_AMBULATORY_CARE_PROVIDER_SITE_OTHER): Payer: Medicare PPO | Admitting: Family Medicine

## 2011-02-07 ENCOUNTER — Telehealth: Payer: Self-pay | Admitting: Family Medicine

## 2011-02-07 ENCOUNTER — Other Ambulatory Visit: Payer: Self-pay | Admitting: Family Medicine

## 2011-02-07 ENCOUNTER — Ambulatory Visit (HOSPITAL_COMMUNITY)
Admission: RE | Admit: 2011-02-07 | Discharge: 2011-02-07 | Disposition: A | Discharge status: Home / Self Care | Payer: Medicare PPO | Source: Ambulatory Visit | Attending: Family Medicine | Admitting: Family Medicine

## 2011-02-07 DIAGNOSIS — I517 Cardiomegaly: Secondary | ICD-10-CM | POA: Insufficient documentation

## 2011-02-07 DIAGNOSIS — Z90710 Acquired absence of both cervix and uterus: Secondary | ICD-10-CM | POA: Insufficient documentation

## 2011-02-07 DIAGNOSIS — K5732 Diverticulitis of large intestine without perforation or abscess without bleeding: Secondary | ICD-10-CM | POA: Insufficient documentation

## 2011-02-07 DIAGNOSIS — K8020 Calculus of gallbladder without cholecystitis without obstruction: Secondary | ICD-10-CM | POA: Insufficient documentation

## 2011-02-07 DIAGNOSIS — K429 Umbilical hernia without obstruction or gangrene: Secondary | ICD-10-CM | POA: Insufficient documentation

## 2011-02-07 DIAGNOSIS — R1032 Left lower quadrant pain: Secondary | ICD-10-CM

## 2011-02-07 DIAGNOSIS — R1904 Left lower quadrant abdominal swelling, mass and lump: Secondary | ICD-10-CM

## 2011-02-07 DIAGNOSIS — N281 Cyst of kidney, acquired: Secondary | ICD-10-CM | POA: Insufficient documentation

## 2011-02-07 DIAGNOSIS — Z8542 Personal history of malignant neoplasm of other parts of uterus: Secondary | ICD-10-CM | POA: Insufficient documentation

## 2011-02-07 LAB — CONVERTED CEMR LAB
AST: 9 units/L (ref 0–37)
Albumin: 3.9 g/dL (ref 3.5–5.2)
Alkaline Phosphatase: 85 units/L (ref 39–117)
BUN: 17 mg/dL (ref 6–23)
Basophils Relative: 0 % (ref 0–1)
Calcium: 8.6 mg/dL (ref 8.4–10.5)
Chloride: 103 meq/L (ref 96–112)
Creatinine, Ser: 0.8 mg/dL (ref 0.40–1.20)
Eosinophils Absolute: 0 10*3/uL (ref 0.0–0.7)
Glucose, Bld: 138 mg/dL — ABNORMAL HIGH (ref 70–99)
Hemoglobin: 8.8 g/dL — ABNORMAL LOW (ref 12.0–15.0)
Lymphs Abs: 1.8 10*3/uL (ref 0.7–4.0)
MCHC: 33.6 g/dL (ref 30.0–36.0)
MCV: 97.8 fL (ref 78.0–100.0)
Monocytes Absolute: 0.1 10*3/uL (ref 0.1–1.0)
Monocytes Relative: 2 % — ABNORMAL LOW (ref 3–12)
Potassium: 3.7 meq/L (ref 3.5–5.3)
RBC: 2.68 M/uL — ABNORMAL LOW (ref 3.87–5.11)
WBC: 3.5 10*3/uL — ABNORMAL LOW (ref 4.0–10.5)

## 2011-02-07 MED ORDER — IOHEXOL 300 MG/ML  SOLN
100.0000 mL | Freq: Once | INTRAMUSCULAR | Status: AC | PRN
  Administered 2011-02-07: 100 mL via INTRAVENOUS

## 2011-02-08 LAB — BASIC METABOLIC PANEL
CO2: 27 mEq/L (ref 19–32)
GFR calc non Af Amer: >60 mL/min (ref >60)
Glucose, Bld: 107 mg/dL — ABNORMAL HIGH (ref 70–99)
Potassium: 3.9 mEq/L (ref 3.5–5.1)
Sodium: 139 mEq/L (ref 135–145)

## 2011-02-08 LAB — CBC
HCT: 25.6 % — ABNORMAL LOW (ref 36.0–46.0)
HCT: 32.6 % — ABNORMAL LOW (ref 36.0–46.0)
Hemoglobin: 8.3 g/dL — ABNORMAL LOW (ref 12.0–15.0)
MCH: 31.8 pg (ref 26.0–34.0)
MCHC: 32.4 g/dL (ref 30.0–36.0)
Platelets: 176 10*3/uL (ref 150–400)
RBC: 2.61 MIL/uL — ABNORMAL LOW (ref 3.87–5.11)
RDW: 21.5 % — ABNORMAL HIGH (ref 11.5–15.5)
WBC: 6 10*3/uL (ref 4.0–10.5)

## 2011-02-08 LAB — COMPREHENSIVE METABOLIC PANEL
ALT: 15 U/L (ref 0–35)
AST: 15 U/L (ref 0–37)
Albumin: 3.7 g/dL (ref 3.5–5.2)
Alkaline Phosphatase: 72 U/L (ref 39–117)
Glucose, Bld: 112 mg/dL — ABNORMAL HIGH (ref 70–99)
Potassium: 4.4 mEq/L (ref 3.5–5.1)
Sodium: 142 mEq/L (ref 135–145)
Total Protein: 7.3 g/dL (ref 6.0–8.3)

## 2011-02-08 LAB — DIFFERENTIAL
Basophils Relative: 0 % (ref 0–1)
Eosinophils Absolute: 0.1 10*3/uL (ref 0.0–0.7)
Monocytes Absolute: 0.4 10*3/uL (ref 0.1–1.0)
Neutro Abs: 3.5 10*3/uL (ref 1.7–7.7)

## 2011-02-08 LAB — SURGICAL PCR SCREEN: MRSA, PCR: NEGATIVE

## 2011-02-08 LAB — HEMOGLOBIN AND HEMATOCRIT, BLOOD: HCT: 25.5 % — ABNORMAL LOW (ref 36.0–46.0)

## 2011-02-08 NOTE — Letter (Signed)
Summary: [facility]  [facility]   Imported By: Lanelle Bal 02/04/2011 12:09:12  _____________________________________________________________________  External Attachment:    Type:   Image     Comment:   External Document

## 2011-02-11 LAB — CBC
Hemoglobin: 12.1 g/dL (ref 12.0–15.0)
MCH: 30.2 pg (ref 26.0–34.0)
RBC: 4.02 MIL/uL (ref 3.87–5.11)
WBC: 6.8 10*3/uL (ref 4.0–10.5)

## 2011-02-11 LAB — TYPE AND SCREEN: ABO/RH(D): O NEG

## 2011-02-11 LAB — COMPREHENSIVE METABOLIC PANEL
ALT: 13 U/L (ref 0–35)
AST: 13 U/L (ref 0–37)
Albumin: 3.6 g/dL (ref 3.5–5.2)
Alkaline Phosphatase: 66 U/L (ref 39–117)
CO2: 28 mEq/L (ref 19–32)
Chloride: 106 mEq/L (ref 96–112)
Creatinine, Ser: 0.81 mg/dL (ref 0.4–1.2)
GFR calc Af Amer: >60 mL/min (ref >60)
GFR calc non Af Amer: >60 mL/min (ref >60)
Potassium: 4.3 mEq/L (ref 3.5–5.1)
Total Bilirubin: 0.4 mg/dL (ref 0.3–1.2)

## 2011-02-11 LAB — DIFFERENTIAL
Basophils Absolute: 0 10*3/uL (ref 0.0–0.1)
Basophils Relative: 0 % (ref 0–1)
Eosinophils Absolute: 0.2 10*3/uL (ref 0.0–0.7)
Eosinophils Relative: 2 % (ref 0–5)
Lymphocytes Relative: 26 % (ref 12–46)
Monocytes Absolute: 0.5 10*3/uL (ref 0.1–1.0)

## 2011-02-15 NOTE — Progress Notes (Signed)
  Phone Note Outgoing Call   Summary of Call: CT scan shows acute diverticulitis with no abscess, pt tells me that she is already on antibiotics per Dr. Eppie Gibson so I did not call any in Initial call taken by: Etta Grandchild MD,  February 07, 2011 7:48 PM

## 2011-02-15 NOTE — Progress Notes (Signed)
Summary: needs a faxed order now  Phone Note From Other Clinic   Caller: Receptionist Summary of Call: Fanda from [facility] needs a Faxed order for her CT of abdomen and pelvis w/contrast faxed to [phone].Marland KitchenMarland KitchenPatient is there now. Thanks, Michaelle Copas  February 07, 2011 3:40 PM  Initial call taken by: Michaelle Copas,  February 07, 2011 3:40 PM

## 2011-02-15 NOTE — Assessment & Plan Note (Signed)
Summary: LLQ pain   Vital Signs:  Patient profile:   65 year old female Height:      63.5 inches Weight:      257 pounds Pulse rate:   90 / minute BP sitting:   120 / 69  (right arm) Cuff size:   regular  Vitals Entered By: Avon Gully CMA, Duncan Dull) (February 07, 2011 10:56 AM) CC: lower abd discomfort, "feels like its falling", aches on the left lower side started this am   CC:  lower abd discomfort, "feels like its falling", and aches on the left lower side started this am.  History of Present Illness: lower abd discomfort, "feels like its falling", aches on the left lower side started this am. Feels like something has droped in the middle of her pelvis. Having sharp pain on her left side.  Started around 1AM. No fever.  No nausea or vomiting.  Last 4-5 days has had dec BMs. NO dysuria. No other sxs.  Recenty hx of chemo etc for uterine Cancer.  No exacerbating sxs. Some pain with ambulation.   Current Medications (verified): 1)  Paediatric nurse .... Dx: Uterine Cancer, Lower Extremity Weakness, Low Back Pain 2)  Lift Chair .... Dx: Uterine Cancer, Lower Extremity Weakness, Low Back Pain. 3)  Senokot  S 1 Daily 4)  Multivitamins  Caps (Multiple Vitamin) .... Take One Tablet By Mouth Twice A Day 5)  Aciphex 20 Mg Tbec (Rabeprazole Sodium) .... One Tablet By Mouth Daily 6)  Fluoxetine Hcl 20 Mg Tabs (Fluoxetine Hcl) .... Take 1 Tablet By Mouth Once A Day 7)  Ambien 10 Mg Tabs (Zolpidem Tartrate) .... Take 1 Tablet By Mouth Once A Day At Bedtime 8)  Vitamin D3 2000 Unit Caps (Cholecalciferol) .... Take One Tablet By Mouth Once A Day 9)  Vitamin B-12 1000 Mcg Tabs (Cyanocobalamin) .... Take One Tablet By Mouth Once A Day  Allergies (verified): No Known Drug Allergies  Comments:  Nurse/Medical Assistant: The patient's medications and allergies were reviewed with the patient and were updated in the Medication and Allergy Lists. Avon Gully CMA, Duncan Dull) (February 07, 2011 11:01  AM)  Past History:  Past Medical History: Last updated: 06/24/2010 Dr. Laurette Schimke.   Past Surgical History: Last updated: 12/09/2010 Complete hysterectomcy  Portacath.   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  Originally from [place], [place].   Alcohol use-no Drug use-no Regular exercise-no  Physical Exam  General:  Well-developed,well-nourished,in no acute distress; alert,appropriate and cooperative throughout examination Head:  Normocephalic and atraumatic without obvious abnormalities. No apparent alopecia or balding. Neck:  No deformities, masses, or tenderness noted. Lungs:  Normal respiratory effort, chest expands symmetrically. Lungs are clear to auscultation, no crackles or wheezes. Heart:  Normal rate and regular rhythm. S1 and S2 normal without gallop, murmur, click, rub or other extra sounds. Abdomen:  Dec BS. INcreased tympany Very tender in the left lower quandrant and near the umbilicus.   Skin:  no rashes.   Psych:  Cognition and judgment appear intact. Alert and cooperative with normal attention span and concentration. No apparent delusions, illusions, hallucinations   Impression & Recommendations:  Problem # 1:  ABDOMINAL PAIN, LEFT LOWER QUADRANT (ICD-789.04) She is very tender will get CT of abdomen/pelvis or contrast.  She also feel like something is full and has "dropped". No fever  which is reassuring.  Will get STAT CBC and CMP. Will need CR/BUN for the CT abd/pel.  Will get her scheduled today for CT.   T-CBC w/Diff [phone] T-Comprehensive Metabolic Panel [phone]) T-CT Abdomen/pelvis w (96295)  Complete Medication List: 1)  Shower Chair  .... Dx: uterine cancer, lower extremity weakness, low back pain 2)  Lift Chair  .... Dx: uterine cancer, lower extremity weakness,  low back pain. 3)  Senokot S 1 Daily  4)  Multivitamins Caps (Multiple vitamin) .... Take one tablet by mouth twice a day 5)  Aciphex 20 Mg Tbec (Rabeprazole sodium) .... One tablet by mouth daily 6)  Fluoxetine Hcl 20 Mg Tabs (Fluoxetine hcl) .... Take 1 tablet by mouth once a day 7)  Ambien 10 Mg Tabs (Zolpidem tartrate) .... Take 1 tablet by mouth once a day at bedtime 8)  Vitamin D3 2000 Unit Caps (Cholecalciferol) .... Take one tablet by mouth once a day 9)  Vitamin B-12 1000 Mcg Tabs (Cyanocobalamin) .... Take one tablet by mouth once a day   Orders Added: 1)  T-CBC w/Diff [[id number]] 2)  T-CT Abdomen/pelvis w [74177] 3)  T-Comprehensive Metabolic Panel [[id number]] 4)  Est. Patient Level IV [02725]

## 2011-02-21 ENCOUNTER — Ambulatory Visit: Payer: Medicare PPO | Attending: Radiation Oncology | Admitting: Radiation Oncology

## 2011-02-21 ENCOUNTER — Other Ambulatory Visit: Payer: Self-pay | Admitting: Oncology

## 2011-02-21 ENCOUNTER — Encounter (HOSPITAL_BASED_OUTPATIENT_CLINIC_OR_DEPARTMENT_OTHER): Payer: Medicare PPO | Admitting: Oncology

## 2011-02-21 DIAGNOSIS — D649 Anemia, unspecified: Secondary | ICD-10-CM | POA: Insufficient documentation

## 2011-02-21 DIAGNOSIS — M159 Polyosteoarthritis, unspecified: Secondary | ICD-10-CM | POA: Insufficient documentation

## 2011-02-21 DIAGNOSIS — Z90710 Acquired absence of both cervix and uterus: Secondary | ICD-10-CM | POA: Insufficient documentation

## 2011-02-21 DIAGNOSIS — Z510 Encounter for antineoplastic radiation therapy: Secondary | ICD-10-CM | POA: Insufficient documentation

## 2011-02-21 DIAGNOSIS — C549 Malignant neoplasm of corpus uteri, unspecified: Secondary | ICD-10-CM | POA: Insufficient documentation

## 2011-02-21 DIAGNOSIS — Z79899 Other long term (current) drug therapy: Secondary | ICD-10-CM | POA: Insufficient documentation

## 2011-02-21 DIAGNOSIS — J40 Bronchitis, not specified as acute or chronic: Secondary | ICD-10-CM | POA: Insufficient documentation

## 2011-02-21 DIAGNOSIS — R197 Diarrhea, unspecified: Secondary | ICD-10-CM | POA: Insufficient documentation

## 2011-02-21 DIAGNOSIS — Z87891 Personal history of nicotine dependence: Secondary | ICD-10-CM | POA: Insufficient documentation

## 2011-02-21 DIAGNOSIS — Z9079 Acquired absence of other genital organ(s): Secondary | ICD-10-CM | POA: Insufficient documentation

## 2011-02-21 LAB — CBC WITH DIFFERENTIAL/PLATELET
Basophils Absolute: 0 10*3/uL (ref 0.0–0.1)
Eosinophils Absolute: 0.1 10*3/uL (ref 0.0–0.5)
HCT: 24.9 % — ABNORMAL LOW (ref 34.8–46.6)
HGB: 8.5 g/dL — ABNORMAL LOW (ref 11.6–15.9)
MCH: 33.7 pg (ref 25.1–34.0)
NEUT#: 1.8 10*3/uL (ref 1.5–6.5)
NEUT%: 48.7 % (ref 38.4–76.8)
RDW: 20.4 % — ABNORMAL HIGH (ref 11.2–14.5)
lymph#: 1.5 10*3/uL (ref 0.9–3.3)

## 2011-02-22 ENCOUNTER — Ambulatory Visit (INDEPENDENT_AMBULATORY_CARE_PROVIDER_SITE_OTHER): Payer: Medicare PPO | Admitting: Family Medicine

## 2011-02-22 VITALS — BP 128/72 | HR 79 | Ht 63.5 in | Wt 255.0 lb

## 2011-02-22 DIAGNOSIS — K5792 Diverticulitis of intestine, part unspecified, without perforation or abscess without bleeding: Secondary | ICD-10-CM

## 2011-02-22 DIAGNOSIS — K5732 Diverticulitis of large intestine without perforation or abscess without bleeding: Secondary | ICD-10-CM

## 2011-02-22 MED ORDER — ZOLPIDEM TARTRATE 10 MG PO TABS: 10.0000 mg | ORAL_TABLET | Freq: Every evening | ORAL | Status: DC | PRN

## 2011-02-22 NOTE — Patient Instructions (Signed)
Call me if symptoms recur.

## 2011-02-22 NOTE — Progress Notes (Signed)
  Subjective:    Patient ID: [name], female    DOB: 07-02-1946, 65 y.o.   MRN: [id number]  HPI Dx diverticulitis on 02-07-11 by CT. Tx with bactrim and metronidazole. Has completd the antibiotic. No fever and pain has resolve. Normal BMs.  No adominal tenderness.   She is starting brachythearpy in about a week for chemotherapy.  This will cause nausea, etc.  She brought in some paperwork regarding this from her oncologist.    Review of Systems     Objective:   Physical Exam  Constitutional: She appears well-developed and well-nourished.  HENT:  Head: Normocephalic and atraumatic.  Cardiovascular: Normal rate, regular rhythm and normal heart sounds.   Pulmonary/Chest: Effort normal and breath sounds normal.  Abdominal: Soft. Bowel sounds are normal. She exhibits no distension and no mass. There is no tenderness. There is no rebound.          Assessment & Plan:  1. Diverticulitis. I think this has resolved. Her sxs and exam are normal.  Call if sxs recurn. Stay hydrated.  No need to make any sig dietary changes. Can continue the senokot.

## 2011-02-24 ENCOUNTER — Ambulatory Visit: Payer: Medicare PPO | Attending: Gynecologic Oncology | Admitting: Gynecologic Oncology

## 2011-02-24 DIAGNOSIS — Z9079 Acquired absence of other genital organ(s): Secondary | ICD-10-CM | POA: Insufficient documentation

## 2011-02-24 DIAGNOSIS — Z90710 Acquired absence of both cervix and uterus: Secondary | ICD-10-CM | POA: Insufficient documentation

## 2011-02-24 DIAGNOSIS — C549 Malignant neoplasm of corpus uteri, unspecified: Secondary | ICD-10-CM | POA: Insufficient documentation

## 2011-02-24 DIAGNOSIS — K5732 Diverticulitis of large intestine without perforation or abscess without bleeding: Secondary | ICD-10-CM | POA: Insufficient documentation

## 2011-02-25 ENCOUNTER — Other Ambulatory Visit: Payer: Self-pay | Admitting: Oncology

## 2011-02-25 ENCOUNTER — Encounter (HOSPITAL_BASED_OUTPATIENT_CLINIC_OR_DEPARTMENT_OTHER): Payer: Medicare PPO | Admitting: Oncology

## 2011-02-25 DIAGNOSIS — C549 Malignant neoplasm of corpus uteri, unspecified: Secondary | ICD-10-CM

## 2011-02-25 DIAGNOSIS — Z5111 Encounter for antineoplastic chemotherapy: Secondary | ICD-10-CM

## 2011-02-25 LAB — CBC WITH DIFFERENTIAL/PLATELET
Basophils Absolute: 0 10*3/uL (ref 0.0–0.1)
EOS%: 2.1 % (ref 0.0–7.0)
Eosinophils Absolute: 0.1 10*3/uL (ref 0.0–0.5)
HCT: 24.6 % — ABNORMAL LOW (ref 34.8–46.6)
HGB: 8.4 g/dL — ABNORMAL LOW (ref 11.6–15.9)
LYMPH%: 40.9 % (ref 14.0–49.7)
MCH: 34 pg (ref 25.1–34.0)
MCV: 99.8 fL (ref 79.5–101.0)
MONO%: 8 % (ref 0.0–14.0)
NEUT#: 1.9 10*3/uL (ref 1.5–6.5)
NEUT%: 48.2 % (ref 38.4–76.8)
Platelets: 73 10*3/uL — ABNORMAL LOW (ref 145–400)

## 2011-02-25 NOTE — Consult Note (Signed)
NAMEARMIYAH, CAPRON NO.:  [id number]  MEDICAL RECORD NO.:  [id number]           PATIENT TYPE:  LOCATION:                                 FACILITY:  PHYSICIAN:  Laurette Schimke, MD     DATE OF BIRTH:  06-Feb-1946  DATE OF CONSULTATION:  02/24/2011 DATE OF DISCHARGE:                                CONSULTATION   REASON FOR VISIT:  Stage IIIC endometrial cancer, currently receiving adjuvant therapy.  HISTORY OF PRESENT ILLNESS:  This is a 65 year old with irregular menstrual bleeding since May 2011.  She presented in December 2011 to Dr. Penne Lash and an endometrial biopsy demonstrated a grade 1 endometrial cancer.  She was evaluated with a plan for hysterectomy and lymph node dissection.  During the interval, she had tumor progression that replaced the cervix.  A PET scan demonstrated uterine and cervical involvement with endometrial cancer.  She received 3 cycles of neoadjuvant Taxol/carboplatin therapy and then underwent robotic- assisted laparoscopic hysterectomy, bilateral salpingo-oophorectomy, and lymph node sampling in December 2011.  Final pathology demonstrated invasive endometrioid cancer invading into the outer half of the myometrium involving the cervical stroma.  Angiolymphatic invasion was present.  Tumor was noted to be FIGO grade 2.  She subsequently has received 3 additional cycles of adjuvant Taxol/carboplatin therapy, completed in March 2012.  She has been evaluated by Dr. Roselind Messier and the plan is for external-beam pelvic therapy with vaginal brachytherapy.  Ms. Prindiville is doing extremely well.  She denies nausea, vomiting, abdominal pain, fever, chills.  She had had significant weight loss and this has been professionally managed.  She reports an episode of diverticulitis last week, managed successfully with antibiotics with no residual pain or discomfort.  PAST MEDICAL HISTORY: 1. Morbid obesity for 7 years. 2. Stage IIIC endometrial  cancer. 3. Diverticulosis.  PAST SURGICAL HISTORY:  Bilateral ovarian wedge resection in 1973, robotic laparoscopic hysterectomy, bilateral salpingo-oophorectomy, lymph node sampling in December 2011.  REVIEW OF SYSTEMS:  Ten-point review of systems with positive as noted above.  PHYSICAL EXAMINATION:  GENERAL:  A well-developed female in no acute distress. VITAL SIGNS:  Weight 258 pounds.  Blood pressure 132/72, pulse of 78. CHEST:  Clear to auscultation. HEART:  Regular rate and rhythm. ABDOMEN:  Soft, obese.  Laparoscopic trocar sites without any evidence of hernia or erythema or tenderness. BACK:  No CVA tenderness. EXTREMITIES:  1 to 2+ pedal edema bilaterally.  No clubbing or cyanosis. PELVIC:  Normal external genitalia, Bartholin, urethral, and Skene.  No lesions noted within the vaginal vault.  No nodularity in the cul-de-sac or rectovaginal septum. RECTAL:  External hemorrhoids appreciated.  No rectal wall tenderness.  IMPRESSION:  Stage IIIC endometrial adenocarcinoma, status post 6 cycles of Taxol/carboplatin with robotic hysterectomy, bilateral salpingo- oophorectomy during the midcourse of her chemotherapy treatment.  The plan is for radiotherapy, external beam, and vaginal brachytherapy. All of Ms. Koestner's questions were answered, she is doing well and is in great spirits.     Laurette Schimke, MD     WB/MEDQ  D:  02/24/2011  T:  02/25/2011  Job:  [id number]  cc:   Telford Nab, R.N. [address] [place], [place] [address]  Lennis P. Darrold Span, M.D. Fax: [phone]  Billie Lade, Ph.D., M.D. Fax: [phone]  Lesly Dukes, M.D.  Hollice Gong, M.D.  Electronically Signed by Laurette Schimke MD on 02/25/2011 10:05:31 AM

## 2011-02-27 DIAGNOSIS — K5790 Diverticulosis of intestine, part unspecified, without perforation or abscess without bleeding: Secondary | ICD-10-CM

## 2011-02-27 HISTORY — DX: Diverticulosis of intestine, part unspecified, without perforation or abscess without bleeding: K57.90

## 2011-03-07 ENCOUNTER — Other Ambulatory Visit: Payer: Self-pay | Admitting: Oncology

## 2011-03-07 ENCOUNTER — Encounter (HOSPITAL_BASED_OUTPATIENT_CLINIC_OR_DEPARTMENT_OTHER): Payer: Medicare PPO | Admitting: Oncology

## 2011-03-07 DIAGNOSIS — C549 Malignant neoplasm of corpus uteri, unspecified: Secondary | ICD-10-CM

## 2011-03-07 DIAGNOSIS — Z5111 Encounter for antineoplastic chemotherapy: Secondary | ICD-10-CM

## 2011-03-07 LAB — CBC WITH DIFFERENTIAL/PLATELET
EOS%: 1.9 % (ref 0.0–7.0)
Eosinophils Absolute: 0.1 10*3/uL (ref 0.0–0.5)
LYMPH%: 36 % (ref 14.0–49.7)
MCH: 33.1 pg (ref 25.1–34.0)
MCHC: 32.5 g/dL (ref 31.5–36.0)
MCV: 101.9 fL — ABNORMAL HIGH (ref 79.5–101.0)
MONO%: 6.2 % (ref 0.0–14.0)
NEUT#: 3.9 10*3/uL (ref 1.5–6.5)
Platelets: 107 10*3/uL — ABNORMAL LOW (ref 145–400)
RBC: 2.63 10*6/uL — ABNORMAL LOW (ref 3.70–5.45)
nRBC: 0 % (ref 0–0)

## 2011-03-16 ENCOUNTER — Encounter (HOSPITAL_BASED_OUTPATIENT_CLINIC_OR_DEPARTMENT_OTHER): Payer: Medicare PPO | Admitting: Oncology

## 2011-03-16 ENCOUNTER — Other Ambulatory Visit: Payer: Self-pay | Admitting: Oncology

## 2011-03-16 DIAGNOSIS — Z5111 Encounter for antineoplastic chemotherapy: Secondary | ICD-10-CM

## 2011-03-16 DIAGNOSIS — C549 Malignant neoplasm of corpus uteri, unspecified: Secondary | ICD-10-CM

## 2011-03-16 LAB — CBC WITH DIFFERENTIAL/PLATELET
BASO%: 0.1 % (ref 0.0–2.0)
EOS%: 1.9 % (ref 0.0–7.0)
HCT: 23.9 % — ABNORMAL LOW (ref 34.8–46.6)
LYMPH%: 23.3 % (ref 14.0–49.7)
MCH: 35.5 pg — ABNORMAL HIGH (ref 25.1–34.0)
MCHC: 34.7 g/dL (ref 31.5–36.0)
NEUT%: 69.7 % (ref 38.4–76.8)
Platelets: 104 10*3/uL — ABNORMAL LOW (ref 145–400)
RBC: 2.34 10*6/uL — ABNORMAL LOW (ref 3.70–5.45)
lymph#: 1 10*3/uL (ref 0.9–3.3)

## 2011-03-24 ENCOUNTER — Encounter: Payer: Self-pay | Admitting: Family Medicine

## 2011-03-24 ENCOUNTER — Telehealth: Payer: Self-pay | Admitting: Family Medicine

## 2011-03-24 ENCOUNTER — Ambulatory Visit (INDEPENDENT_AMBULATORY_CARE_PROVIDER_SITE_OTHER): Payer: Medicare PPO | Admitting: Family Medicine

## 2011-03-24 ENCOUNTER — Ambulatory Visit
Admission: RE | Admit: 2011-03-24 | Discharge: 2011-03-24 | Disposition: A | Discharge status: Home / Self Care | Payer: Medicare PPO | Source: Ambulatory Visit | Attending: Family Medicine | Admitting: Family Medicine

## 2011-03-24 VITALS — BP 108/65 | HR 77 | Ht 63.5 in | Wt 245.0 lb

## 2011-03-24 DIAGNOSIS — R05 Cough: Secondary | ICD-10-CM

## 2011-03-24 DIAGNOSIS — C55 Malignant neoplasm of uterus, part unspecified: Secondary | ICD-10-CM

## 2011-03-24 DIAGNOSIS — R059 Cough, unspecified: Secondary | ICD-10-CM

## 2011-03-24 DIAGNOSIS — M545 Low back pain: Secondary | ICD-10-CM

## 2011-03-24 DIAGNOSIS — R197 Diarrhea, unspecified: Secondary | ICD-10-CM

## 2011-03-24 MED ORDER — DOXYCYCLINE MONOHYDRATE 100 MG PO TABS: 100.0000 mg | ORAL_TABLET | Freq: Two times a day (BID) | ORAL | Status: AC

## 2011-03-24 NOTE — Telephone Encounter (Signed)
Called and left message on pt's vm with dr. instructions

## 2011-03-24 NOTE — Telephone Encounter (Signed)
Call patient: She does bronchitis on her chest x-ray. I will send him for an antibiotic. I want her to really work on staying hydrated. Also if she can come back for lab either later today or tomorrow morning to get some blood work and a stool sample. Because she was on antibiotics I want to make sure that she does not have some occult C. difficile. Certainly she can for now use her Imodium if it is helpful for her. We will also work on seeing if she qualifies for home health services and possibly personal care services.

## 2011-03-24 NOTE — Progress Notes (Signed)
  Subjective:    Patient ID: [name], female    DOB: Mar 11, 1946, 65 y.o.   MRN: [id number]  HPI.   5 days of productive cough.  No fever. Productive cough. Fever initially. Runnin 102. 5.  Feels SOB with acitivity.  Some nasal congestion and post nasal drip. Right ear popping. No ST.  new medications for her symptoms.  Diarrhea for 5 days. Water.  No recentl antibiotics. Not eating well.  No blood in the stool. Taking immodium. No abomdinal carmping with it.   She is tearful here in the office today. She says she just feels completely overwhelmed. Her husband is not supportive at all. She's been very sick recently with her chemotherapy is really not have anyone to help her. Since her house is a mess because she cannot keep up with keeping it clean right now. She has a son who lives in [place] he just had neck surgery CT of the common help her. She feels overwhelmed and said she would just like to be admitted to the hospital this visit she can have a break.   Review of Systems     Objective:   Physical Exam  Constitutional: She appears well-developed and well-nourished.  HENT:  Head: Normocephalic and atraumatic.  Right Ear: External ear normal.  Left Ear: External ear normal.  Nose: Nose normal.  Mouth/Throat: Oropharynx is clear and moist.  Eyes: Conjunctivae and EOM are normal. Pupils are equal, round, and reactive to light.  Neck: Neck supple. No thyromegaly present.  Cardiovascular: Normal rate, regular rhythm, normal heart sounds and intact distal pulses.   Pulmonary/Chest: Effort normal.       Rhonchi at the bases bilaterally.  Abdominal: Soft. Bowel sounds are normal.  Lymphadenopathy:    She has no cervical adenopathy.  Skin: Skin is warm and dry.  Psychiatric: She has a normal mood and affect.          Assessment & Plan:  Likely bronchitis-I would like to get a chest x-ray since her exam is abnormal today and she is feeling short of breath with activity. This  will better determine if she has bronchitis or pneumonia.  Diarrhea consider C. difficile. This may actually be functional diarrhea from her chemotherapy. She needs to really work on increasing her fluids to avoid dehydration. Certainly if the Imodium seems to help she can use this.

## 2011-03-25 ENCOUNTER — Telehealth: Payer: Self-pay | Admitting: Family Medicine

## 2011-03-25 ENCOUNTER — Other Ambulatory Visit: Payer: Medicare PPO

## 2011-03-25 MED ORDER — FLUOXETINE HCL 20 MG PO CAPS: 40.0000 mg | ORAL_CAPSULE | Freq: Every day | ORAL | Status: DC

## 2011-03-25 NOTE — Progress Notes (Signed)
Addended by: Nani Gasser on: 03/25/2011 08:04 AM   Modules accepted: Orders

## 2011-03-25 NOTE — Telephone Encounter (Signed)
[place]wood Sink from radiation oncology called on 03-24-11 to speak with nurse.  Patient was seen 03-24-11 by Dr Roselind Messier.  Would like to speak to nurse regarding this.  2 messages left for [facility] by Francee Piccolo, CMA on 03-24-11.  Returned call on 03-25-11, and was notified by Talbert Forest that [place] Sink was out of the office and would not return until Monday.  Judeth Cornfield was told that [place] Sink would return the call on Monday.  Dr. Linford Arnold would you like to call Dr. Roselind Messier or wait until  Sink calls [place] back???  Please advise... [name], LPN [name]

## 2011-03-25 NOTE — Progress Notes (Signed)
  Subjective:    Patient ID: [name], female    DOB: October 01, 1946, 65 y.o.   MRN: [id number]  HPI    Review of Systems     Objective:   Physical Exam        Assessment & Plan:  PHQ-9 score of 21 today. Also will increase her prozac.

## 2011-03-26 LAB — CBC WITH DIFFERENTIAL/PLATELET
Basophils Absolute: 0 10*3/uL (ref 0.0–0.1)
Lymphocytes Relative: 30 % (ref 12–46)
Neutro Abs: 1.6 10*3/uL — ABNORMAL LOW (ref 1.7–7.7)
Neutrophils Relative %: 58 % (ref 43–77)
Platelets: 75 10*3/uL — ABNORMAL LOW (ref 150–400)
RDW: 17.9 % — ABNORMAL HIGH (ref 11.5–15.5)
WBC: 2.7 10*3/uL — ABNORMAL LOW (ref 4.0–10.5)

## 2011-03-26 LAB — BASIC METABOLIC PANEL WITH GFR
Calcium: 8.9 mg/dL (ref 8.4–10.5)
Creat: 1.02 mg/dL (ref 0.40–1.20)
GFR, Est Non African American: 54 mL/min — ABNORMAL LOW (ref >60)

## 2011-03-27 ENCOUNTER — Telehealth: Payer: Self-pay | Admitting: Family Medicine

## 2011-03-27 NOTE — Telephone Encounter (Signed)
Call pt: White ct and platelets are low. Find out when her last chemo tx was.

## 2011-03-28 ENCOUNTER — Encounter (HOSPITAL_BASED_OUTPATIENT_CLINIC_OR_DEPARTMENT_OTHER): Payer: Medicare PPO | Admitting: Oncology

## 2011-03-28 ENCOUNTER — Other Ambulatory Visit: Payer: Self-pay | Admitting: Oncology

## 2011-03-28 DIAGNOSIS — C549 Malignant neoplasm of corpus uteri, unspecified: Secondary | ICD-10-CM

## 2011-03-28 DIAGNOSIS — Z5111 Encounter for antineoplastic chemotherapy: Secondary | ICD-10-CM

## 2011-03-28 DIAGNOSIS — E876 Hypokalemia: Secondary | ICD-10-CM

## 2011-03-28 LAB — IRON AND TIBC
%SAT: 35 % (ref 20–55)
Iron: 87 ug/dL (ref 42–145)
UIBC: 159 ug/dL

## 2011-03-28 LAB — COMPREHENSIVE METABOLIC PANEL
ALT: 13 U/L (ref 0–35)
Alkaline Phosphatase: 63 U/L (ref 39–117)
CO2: 22 mEq/L (ref 19–32)
Creatinine, Ser: 0.96 mg/dL (ref 0.40–1.20)
Glucose, Bld: 125 mg/dL — ABNORMAL HIGH (ref 70–99)
Sodium: 143 mEq/L (ref 135–145)
Total Bilirubin: 0.3 mg/dL (ref 0.3–1.2)

## 2011-03-28 LAB — CBC WITH DIFFERENTIAL/PLATELET
BASO%: 0.4 % (ref 0.0–2.0)
HGB: 8.3 g/dL — ABNORMAL LOW (ref 11.6–15.9)
LYMPH%: 20.9 % (ref 14.0–49.7)
MCHC: 34.1 g/dL (ref 31.5–36.0)
MCV: 103.3 fL — ABNORMAL HIGH (ref 79.5–101.0)
MONO#: 0.2 10*3/uL (ref 0.1–0.9)
MONO%: 8.2 % (ref 0.0–14.0)
NEUT#: 1.8 10*3/uL (ref 1.5–6.5)
NEUT%: 67.4 % (ref 38.4–76.8)
Platelets: 73 10*3/uL — ABNORMAL LOW (ref 145–400)
RBC: 2.37 10*6/uL — ABNORMAL LOW (ref 3.70–5.45)

## 2011-03-28 LAB — FERRITIN: Ferritin: 295 ng/mL — ABNORMAL HIGH (ref 10–291)

## 2011-03-28 NOTE — Telephone Encounter (Signed)
Called and LM with Diplomatic Services operational officer for physician or nurse to call me back.

## 2011-03-29 NOTE — Telephone Encounter (Signed)
OK tht explains her blood work. Is she feeling better? Is her breathing better? Has she heard from home health ye?. I put in a ref for this

## 2011-03-29 NOTE — Telephone Encounter (Signed)
Left message on pt's cell and at pt's last office visit she said that she had a tx the day before

## 2011-03-29 NOTE — Telephone Encounter (Signed)
[place] Sink at Radiology Oncology returned your call today at 11:44 am.  You can call her back on [phone].

## 2011-04-01 ENCOUNTER — Telehealth: Payer: Self-pay | Admitting: *Deleted

## 2011-04-01 MED ORDER — WALKER MISC: Status: DC

## 2011-04-01 NOTE — Telephone Encounter (Signed)
Marchelle Folks from [facility] called and wanted you to know that she was going to suggest that home health aid come 2 x a week to help with bathing, and a PT and OT eval  Because pt is stumbling more and feels very tired and weak,as well as a Set designer .

## 2011-04-01 NOTE — Telephone Encounter (Signed)
[name] at [facility] called and feels the patient is not taking the potassium as ordered.   Pt told [facility] nurse she is taking   Only (1) Klorcon daily, but should be using  (2) 20 meq daily.  Prescribed potassium by another provider not in this office.  Pt will start taking 2 tablets as prescribed  Going back to oncologist on Monday who prescribed Dr. Vickii Penna also  notified by the [facility] nurse.  FYI.  ALso, pt had a visitor today wearing mask and he was asked why.  Driver told nurse exposed to his grandfather who has TB, and that he was wearing a mask to protect.  Should we be concerned for the pt?  Off balance today but not using her cane.  Husband stating doing a lot lately.  Can we get an order for a walker?   Plan:  Routed to Dr. Marlyne Beards, LPN [name]

## 2011-04-01 NOTE — Telephone Encounter (Signed)
OK for home health aid come 2 x a week to help with bathing, and a PT and OT eval Because pt is stumbling more and feels very tired and weak,as well as a Set designer . Also OK for walker if needs one. Will put in order so we can fax to the [facility] company.

## 2011-04-01 NOTE — Telephone Encounter (Signed)
Spoke with pt and she is felling better and breathing better. Home health is coming out there today

## 2011-04-04 ENCOUNTER — Other Ambulatory Visit: Payer: Self-pay | Admitting: Oncology

## 2011-04-04 ENCOUNTER — Encounter (HOSPITAL_BASED_OUTPATIENT_CLINIC_OR_DEPARTMENT_OTHER): Payer: Medicare PPO | Admitting: Oncology

## 2011-04-04 DIAGNOSIS — C549 Malignant neoplasm of corpus uteri, unspecified: Secondary | ICD-10-CM

## 2011-04-04 LAB — CBC WITH DIFFERENTIAL/PLATELET
BASO%: 0.2 % (ref 0.0–2.0)
Basophils Absolute: 0 10*3/uL (ref 0.0–0.1)
EOS%: 2.3 % (ref 0.0–7.0)
HCT: 26.6 % — ABNORMAL LOW (ref 34.8–46.6)
HGB: 9 g/dL — ABNORMAL LOW (ref 11.6–15.9)
LYMPH%: 15.8 % (ref 14.0–49.7)
MCH: 35.4 pg — ABNORMAL HIGH (ref 25.1–34.0)
MCHC: 33.7 g/dL (ref 31.5–36.0)
MCV: 105 fL — ABNORMAL HIGH (ref 79.5–101.0)
MONO%: 7 % (ref 0.0–14.0)
NEUT%: 74.7 % (ref 38.4–76.8)
Platelets: 73 10*3/uL — ABNORMAL LOW (ref 145–400)
lymph#: 0.8 10*3/uL — ABNORMAL LOW (ref 0.9–3.3)

## 2011-04-04 LAB — COMPREHENSIVE METABOLIC PANEL
AST: 16 U/L (ref 0–37)
BUN: 15 mg/dL (ref 6–23)
Calcium: 9.3 mg/dL (ref 8.4–10.5)
Chloride: 104 mEq/L (ref 96–112)
Creatinine, Ser: 1.07 mg/dL (ref 0.40–1.20)
Total Bilirubin: 0.3 mg/dL (ref 0.3–1.2)

## 2011-04-04 NOTE — Telephone Encounter (Signed)
[name] was notified that Dr. Linford Arnold said okay for home health aide X2 weekly, PT/OTeval, social worker and an order to get a walker.  [facility] will need written order for walker/DME equip. Plan:  Told [name] at [facility] to see what the order that is supposedly already faxed states and if any conflict to call triage nurse back here in our office.  Voiced understanding. Pamela Newcomer, LPN [name]

## 2011-04-06 ENCOUNTER — Telehealth: Payer: Self-pay | Admitting: Family Medicine

## 2011-04-06 NOTE — Telephone Encounter (Signed)
Pt.notified

## 2011-04-06 NOTE — Telephone Encounter (Signed)
Call patient: I just received the lab work from the cancer center. They would like to have me recheck your potassium the first week of June. They will recheck it again at her followup appointment on June 20 but they wanted me to check in between. Her hemoglobin was better at this time. Just call the first week of June and week about lab slip to recheck your potassium.

## 2011-04-06 NOTE — Telephone Encounter (Signed)
They sent over fax with her blood work.

## 2011-04-07 ENCOUNTER — Telehealth: Payer: Self-pay | Admitting: Family Medicine

## 2011-04-07 NOTE — Telephone Encounter (Signed)
Can give verbal order to approve.

## 2011-04-07 NOTE — Telephone Encounter (Signed)
Shawn with [facility] called and wishes to continue PT with patient twice weekly for 5 more weeks.   Plan:  Gave verbal order for this request.  Told Shawn I'll let Dr. Linford Arnold know. Routed to Dr. Marlyne Beards, LPN [name]

## 2011-04-07 NOTE — Telephone Encounter (Signed)
Verbal order was given to Shawn at [facility] already. [name], LPN [name]

## 2011-04-08 ENCOUNTER — Telehealth: Payer: Self-pay | Admitting: Family Medicine

## 2011-04-08 MED ORDER — WALKER MISC: Status: DC

## 2011-04-08 MED ORDER — HUGO ROLLING WALKER MISC: Status: DC

## 2011-04-08 NOTE — Telephone Encounter (Signed)
Melissa from [facility] called and listened to lung sounds today and says pt has crackles posterior lower lobe.  Pt felt tired after radiation yest and twice today.  Still hoarse.  Yellow sputum yesterday and today.  Please advise Also pt had a requested order for rolling walker earlier in week and [facility] still has not received the order for this DME equipment.  Please advise and may fax an order today to:  [phone]. Plan:  Routed to Dr. Linford Arnold and Sue Lush, CMA [name], LPN [name]

## 2011-04-08 NOTE — Telephone Encounter (Signed)
LMOM for [name] nurse with [facility] telling her that pt could followup next week for an office visit to see Dr. Linford Arnold for her cough.  A RX for DME pin rolling walker was sent to the provideed fax number for the pt. [name], LPN [name]

## 2011-04-08 NOTE — Telephone Encounter (Signed)
Can followup next week for her cough. RE - Printed rx for the walker.

## 2011-04-11 ENCOUNTER — Other Ambulatory Visit: Payer: Self-pay | Admitting: Oncology

## 2011-04-11 ENCOUNTER — Encounter (HOSPITAL_BASED_OUTPATIENT_CLINIC_OR_DEPARTMENT_OTHER): Payer: Medicare PPO | Admitting: Oncology

## 2011-04-11 DIAGNOSIS — Z5111 Encounter for antineoplastic chemotherapy: Secondary | ICD-10-CM

## 2011-04-11 DIAGNOSIS — C549 Malignant neoplasm of corpus uteri, unspecified: Secondary | ICD-10-CM

## 2011-04-11 LAB — CBC WITH DIFFERENTIAL/PLATELET
Basophils Absolute: 0 10*3/uL (ref 0.0–0.1)
EOS%: 3.2 % (ref 0.0–7.0)
HCT: 27.4 % — ABNORMAL LOW (ref 34.8–46.6)
HGB: 8.9 g/dL — ABNORMAL LOW (ref 11.6–15.9)
LYMPH%: 22.1 % (ref 14.0–49.7)
MCH: 34 pg (ref 25.1–34.0)
MONO#: 0.3 10*3/uL (ref 0.1–0.9)
NEUT%: 67 % (ref 38.4–76.8)
Platelets: 72 10*3/uL — ABNORMAL LOW (ref 145–400)
lymph#: 0.9 10*3/uL (ref 0.9–3.3)

## 2011-04-12 NOTE — Assessment & Plan Note (Signed)
NAME:  [name], [name] NO.:  [id number]   MEDICAL RECORD NO.:  [id number]          PATIENT TYPE:  POB   LOCATION:  [facility] [place]         FACILITY:  [facility]   PHYSICIAN:  Tinnie Gens, MD        DATE OF BIRTH:  [birth year]   DATE OF SERVICE:  06/15/2010                                  CLINIC NOTE   CHIEF COMPLAINT:  Followup.   HISTORY OF PRESENT ILLNESS:  The patient is a 65 year old gravida 2,  para 2 morbidly obese female who came in with postmenopausal bleeding to  see Dr. Penne Lash on June 09, 2010.  She returns for her pathology, which  shows endometrioid adenocarcinoma FIGO grade 1.  Additionally, the  patient had labs that showed GC and Chlamydia were negative.  Normal  CBC, normal CMP, normal TSH.  Pelvic sonography was done that showed an  enlarged uterus, approximately 14 x 6 x 8 cm with a diffusely enlarged  endometrium.  Ovaries were not seen with confidence during the study.  These studies have been previously imparted to the patient by the nurse  and so she comes today with a few questions including staging questions  and survival rate.  Discussed that this information cannot be  ascertained at this point.  She is scheduled for a CT and a chest x-ray  on Monday of next week and GYN/Oncology will not be back in the office  until Monday.  At that time we will schedule her with them and avail  ourselves with their recommendations.           ______________________________  Tinnie Gens, MD     TP/MEDQ  D:  06/15/2010  T:  06/16/2010  Job:  [id number]
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
## 2011-04-13 ENCOUNTER — Ambulatory Visit: Payer: Medicare PPO | Admitting: Family Medicine

## 2011-04-26 ENCOUNTER — Other Ambulatory Visit: Payer: Self-pay | Admitting: Oncology

## 2011-04-26 ENCOUNTER — Encounter (HOSPITAL_BASED_OUTPATIENT_CLINIC_OR_DEPARTMENT_OTHER): Payer: Medicare PPO | Admitting: Oncology

## 2011-04-26 DIAGNOSIS — C549 Malignant neoplasm of corpus uteri, unspecified: Secondary | ICD-10-CM

## 2011-04-26 DIAGNOSIS — E876 Hypokalemia: Secondary | ICD-10-CM

## 2011-04-26 LAB — CBC WITH DIFFERENTIAL/PLATELET
Basophils Absolute: 0 10*3/uL (ref 0.0–0.1)
Eosinophils Absolute: 0.1 10*3/uL (ref 0.0–0.5)
HGB: 8.3 g/dL — ABNORMAL LOW (ref 11.6–15.9)
MCV: 104.5 fL — ABNORMAL HIGH (ref 79.5–101.0)
MONO#: 0.3 10*3/uL (ref 0.1–0.9)
NEUT#: 2.4 10*3/uL (ref 1.5–6.5)
RDW: 16.7 % — ABNORMAL HIGH (ref 11.2–14.5)
WBC: 3.6 10*3/uL — ABNORMAL LOW (ref 3.9–10.3)
lymph#: 0.8 10*3/uL — ABNORMAL LOW (ref 0.9–3.3)

## 2011-04-29 ENCOUNTER — Encounter: Payer: Self-pay | Admitting: Family Medicine

## 2011-05-03 ENCOUNTER — Other Ambulatory Visit: Payer: Self-pay | Admitting: Oncology

## 2011-05-03 ENCOUNTER — Encounter (HOSPITAL_BASED_OUTPATIENT_CLINIC_OR_DEPARTMENT_OTHER): Payer: Medicare PPO | Admitting: Oncology

## 2011-05-03 DIAGNOSIS — C549 Malignant neoplasm of corpus uteri, unspecified: Secondary | ICD-10-CM

## 2011-05-03 DIAGNOSIS — E876 Hypokalemia: Secondary | ICD-10-CM

## 2011-05-03 LAB — CBC WITH DIFFERENTIAL/PLATELET
Eosinophils Absolute: 0.1 10*3/uL (ref 0.0–0.5)
HCT: 25.2 % — ABNORMAL LOW (ref 34.8–46.6)
HGB: 8 g/dL — ABNORMAL LOW (ref 11.6–15.9)
LYMPH%: 22.5 % (ref 14.0–49.7)
MONO#: 0.3 10*3/uL (ref 0.1–0.9)
NEUT#: 2.1 10*3/uL (ref 1.5–6.5)
NEUT%: 65.1 % (ref 38.4–76.8)
Platelets: 73 10*3/uL — ABNORMAL LOW (ref 145–400)
WBC: 3.2 10*3/uL — ABNORMAL LOW (ref 3.9–10.3)
lymph#: 0.7 10*3/uL — ABNORMAL LOW (ref 0.9–3.3)
nRBC: 1 % — ABNORMAL HIGH (ref 0–0)

## 2011-05-05 ENCOUNTER — Telehealth: Payer: Self-pay | Admitting: Family Medicine

## 2011-05-05 DIAGNOSIS — C569 Malignant neoplasm of unspecified ovary: Secondary | ICD-10-CM

## 2011-05-05 DIAGNOSIS — J441 Chronic obstructive pulmonary disease with (acute) exacerbation: Secondary | ICD-10-CM

## 2011-05-05 DIAGNOSIS — R269 Unspecified abnormalities of gait and mobility: Secondary | ICD-10-CM

## 2011-05-05 DIAGNOSIS — F329 Major depressive disorder, single episode, unspecified: Secondary | ICD-10-CM

## 2011-05-05 DIAGNOSIS — M6281 Muscle weakness (generalized): Secondary | ICD-10-CM

## 2011-05-05 NOTE — Telephone Encounter (Signed)
Pt called and stated her Hgb low <8.0 and she has been sent to [name] tomorrow for blood transfusion.  Her chemo provider is sending her there.  Just wanted Dr. Linford Arnold to know. Plan:  Routed to Dr. Marlyne Beards, LPN [name]

## 2011-05-06 ENCOUNTER — Other Ambulatory Visit: Payer: Self-pay | Admitting: Oncology

## 2011-05-06 ENCOUNTER — Encounter (HOSPITAL_BASED_OUTPATIENT_CLINIC_OR_DEPARTMENT_OTHER): Payer: Medicare PPO | Admitting: Oncology

## 2011-05-06 ENCOUNTER — Encounter (HOSPITAL_COMMUNITY)
Admission: RE | Admit: 2011-05-06 | Discharge: 2011-05-06 | Disposition: A | Discharge status: Home / Self Care | Payer: Medicare PPO | Source: Ambulatory Visit | Attending: Oncology | Admitting: Oncology

## 2011-05-06 DIAGNOSIS — E876 Hypokalemia: Secondary | ICD-10-CM

## 2011-05-06 DIAGNOSIS — D649 Anemia, unspecified: Secondary | ICD-10-CM

## 2011-05-06 DIAGNOSIS — C549 Malignant neoplasm of corpus uteri, unspecified: Secondary | ICD-10-CM

## 2011-05-06 LAB — COMPREHENSIVE METABOLIC PANEL
ALT: 12 U/L (ref 0–35)
CO2: 25 mEq/L (ref 19–32)
Calcium: 8.5 mg/dL (ref 8.4–10.5)
Chloride: 106 mEq/L (ref 96–112)
Glucose, Bld: 117 mg/dL — ABNORMAL HIGH (ref 70–99)
Sodium: 140 mEq/L (ref 135–145)
Total Bilirubin: 0.4 mg/dL (ref 0.3–1.2)
Total Protein: 6.1 g/dL (ref 6.0–8.3)

## 2011-05-08 LAB — CROSSMATCH
Antibody Screen: NEGATIVE
Unit division: 0

## 2011-05-10 ENCOUNTER — Encounter (HOSPITAL_BASED_OUTPATIENT_CLINIC_OR_DEPARTMENT_OTHER): Payer: Medicare PPO | Admitting: Oncology

## 2011-05-10 ENCOUNTER — Encounter (HOSPITAL_COMMUNITY): Payer: Medicare PPO

## 2011-05-10 ENCOUNTER — Other Ambulatory Visit: Payer: Self-pay | Admitting: Oncology

## 2011-05-10 DIAGNOSIS — C549 Malignant neoplasm of corpus uteri, unspecified: Secondary | ICD-10-CM

## 2011-05-10 DIAGNOSIS — E876 Hypokalemia: Secondary | ICD-10-CM

## 2011-05-10 LAB — CBC WITH DIFFERENTIAL/PLATELET
BASO%: 0.3 % (ref 0.0–2.0)
MCHC: 31.9 g/dL (ref 31.5–36.0)
MONO#: 0.4 10*3/uL (ref 0.1–0.9)
RBC: 2.8 10*6/uL — ABNORMAL LOW (ref 3.70–5.45)
WBC: 3.9 10*3/uL (ref 3.9–10.3)
lymph#: 1 10*3/uL (ref 0.9–3.3)
nRBC: 0 % (ref 0–0)

## 2011-05-16 ENCOUNTER — Encounter (HOSPITAL_COMMUNITY): Payer: Medicare PPO

## 2011-05-18 ENCOUNTER — Encounter (HOSPITAL_BASED_OUTPATIENT_CLINIC_OR_DEPARTMENT_OTHER): Payer: Medicare PPO | Admitting: Oncology

## 2011-05-18 ENCOUNTER — Other Ambulatory Visit: Payer: Self-pay | Admitting: Oncology

## 2011-05-18 DIAGNOSIS — E876 Hypokalemia: Secondary | ICD-10-CM

## 2011-05-18 DIAGNOSIS — C549 Malignant neoplasm of corpus uteri, unspecified: Secondary | ICD-10-CM

## 2011-05-18 DIAGNOSIS — Z1231 Encounter for screening mammogram for malignant neoplasm of breast: Secondary | ICD-10-CM

## 2011-05-18 LAB — CBC WITH DIFFERENTIAL/PLATELET
Basophils Absolute: 0 10*3/uL (ref 0.0–0.1)
EOS%: 4.4 % (ref 0.0–7.0)
Eosinophils Absolute: 0.2 10*3/uL (ref 0.0–0.5)
HGB: 9.7 g/dL — ABNORMAL LOW (ref 11.6–15.9)
MCH: 33.2 pg (ref 25.1–34.0)
NEUT#: 2.9 10*3/uL (ref 1.5–6.5)
RBC: 2.92 10*6/uL — ABNORMAL LOW (ref 3.70–5.45)
RDW: 16.7 % — ABNORMAL HIGH (ref 11.2–14.5)
lymph#: 0.9 10*3/uL (ref 0.9–3.3)
nRBC: 0 % (ref 0–0)

## 2011-05-20 ENCOUNTER — Encounter (HOSPITAL_COMMUNITY): Payer: Medicare PPO

## 2011-05-31 ENCOUNTER — Encounter: Payer: Self-pay | Admitting: Gastroenterology

## 2011-06-06 ENCOUNTER — Ambulatory Visit
Admission: RE | Admit: 2011-06-06 | Discharge: 2011-06-06 | Disposition: A | Discharge status: Home / Self Care | Payer: Medicare PPO | Source: Ambulatory Visit | Attending: Radiation Oncology | Admitting: Radiation Oncology

## 2011-06-13 ENCOUNTER — Ambulatory Visit
Admission: RE | Admit: 2011-06-13 | Discharge: 2011-06-13 | Disposition: A | Discharge status: Home / Self Care | Payer: Medicare PPO | Source: Ambulatory Visit | Attending: Oncology | Admitting: Oncology

## 2011-06-13 DIAGNOSIS — Z1231 Encounter for screening mammogram for malignant neoplasm of breast: Secondary | ICD-10-CM

## 2011-06-14 DIAGNOSIS — Z9289 Personal history of other medical treatment: Secondary | ICD-10-CM | POA: Insufficient documentation

## 2011-06-14 HISTORY — DX: Personal history of other medical treatment: Z92.89

## 2011-06-17 ENCOUNTER — Encounter (HOSPITAL_BASED_OUTPATIENT_CLINIC_OR_DEPARTMENT_OTHER): Payer: Medicare PPO | Admitting: Oncology

## 2011-06-17 ENCOUNTER — Other Ambulatory Visit: Payer: Self-pay | Admitting: Oncology

## 2011-06-17 DIAGNOSIS — C549 Malignant neoplasm of corpus uteri, unspecified: Secondary | ICD-10-CM

## 2011-06-17 DIAGNOSIS — E876 Hypokalemia: Secondary | ICD-10-CM

## 2011-06-17 DIAGNOSIS — Z452 Encounter for adjustment and management of vascular access device: Secondary | ICD-10-CM

## 2011-06-17 LAB — CBC WITH DIFFERENTIAL/PLATELET
BASO%: 0.3 % (ref 0.0–2.0)
Basophils Absolute: 0 10*3/uL (ref 0.0–0.1)
EOS%: 3.1 % (ref 0.0–7.0)
HCT: 27.2 % — ABNORMAL LOW (ref 34.8–46.6)
MCH: 33.3 pg (ref 25.1–34.0)
MCHC: 32.7 g/dL (ref 31.5–36.0)
MCV: 101.9 fL — ABNORMAL HIGH (ref 79.5–101.0)
MONO%: 6 % (ref 0.0–14.0)
NEUT#: 2.7 10*3/uL (ref 1.5–6.5)
NEUT%: 69.7 % (ref 38.4–76.8)
RDW: 16.1 % — ABNORMAL HIGH (ref 11.2–14.5)
WBC: 3.8 10*3/uL — ABNORMAL LOW (ref 3.9–10.3)

## 2011-06-17 LAB — COMPREHENSIVE METABOLIC PANEL
Alkaline Phosphatase: 72 U/L (ref 39–117)
BUN: 18 mg/dL (ref 6–23)
Glucose, Bld: 96 mg/dL (ref 70–99)
Total Bilirubin: 0.3 mg/dL (ref 0.3–1.2)

## 2011-07-07 ENCOUNTER — Ambulatory Visit (INDEPENDENT_AMBULATORY_CARE_PROVIDER_SITE_OTHER): Payer: Medicare PPO | Admitting: Gastroenterology

## 2011-07-07 ENCOUNTER — Other Ambulatory Visit (INDEPENDENT_AMBULATORY_CARE_PROVIDER_SITE_OTHER): Payer: Medicare PPO

## 2011-07-07 ENCOUNTER — Encounter: Payer: Self-pay | Admitting: Gastroenterology

## 2011-07-07 DIAGNOSIS — K219 Gastro-esophageal reflux disease without esophagitis: Secondary | ICD-10-CM

## 2011-07-07 DIAGNOSIS — Z79899 Other long term (current) drug therapy: Secondary | ICD-10-CM

## 2011-07-07 DIAGNOSIS — C55 Malignant neoplasm of uterus, part unspecified: Secondary | ICD-10-CM | POA: Insufficient documentation

## 2011-07-07 DIAGNOSIS — K5730 Diverticulosis of large intestine without perforation or abscess without bleeding: Secondary | ICD-10-CM

## 2011-07-07 DIAGNOSIS — D649 Anemia, unspecified: Secondary | ICD-10-CM

## 2011-07-07 LAB — FOLATE: Folate: >24.8 ng/mL (ref >5.9)

## 2011-07-07 LAB — VITAMIN B12: Vitamin B-12: 292 pg/mL (ref 211–911)

## 2011-07-07 LAB — IBC PANEL
Iron: 53 ug/dL (ref 42–145)
Saturation Ratios: 18.4 % — ABNORMAL LOW (ref 20.0–50.0)
Transferrin: 205.7 mg/dL — ABNORMAL LOW (ref 212.0–360.0)

## 2011-07-07 MED ORDER — RABEPRAZOLE SODIUM 20 MG PO TBEC: 20.0000 mg | DELAYED_RELEASE_TABLET | Freq: Every day | ORAL | Status: DC

## 2011-07-07 NOTE — Patient Instructions (Signed)
Stop the Prilosec and take the Aciphex once a day 30 min before breakfast, samples given and rx sent to your pharmacy.  Please go to the basement today for your labs.  We will contact you about scheduling your Colonoscopy and Endoscopy with a physician at the hospital.   Diverticulitis A diverticulum is a small pouch or sac on the colon. Diverticulosis is the presence of these diverticula on the colon. Diverticulitis is the irritation (inflammation) or infection of diverticula. CAUSES The colon and its diverticula contain germs (bacteria). If food particles block the tiny opening to a diverticulum, the bacteria inside can grow and cause an increase in pressure. This leads to infection and inflammation. SYMPTOMS  Belly (abdominal) pain and tenderness. Usually, the pain is located on the left side of your abdomen. However, it could be located elsewhere.   Fever.   Bloating.   Feeling sick to your stomach (nausea).   Throwing up (vomiting).   Abnormal stools.  DIAGNOSIS Your caregiver will take a history and perform a physical exam. Since many things can cause abdominal pain, other tests may be necessary. Tests may include:  Blood tests.  Urine tests.   X-ray of the abdomen.  CT scan of the abdomen.   Sometimes, surgery is needed to determine if diverticulitis or other conditions are causing your symptoms. TREATMENT Most of the time, you can be treated without surgery. Treatment includes:  Resting the bowels by only having liquids for a few days.   Intravenous (IV) fluids if you are losing fluids (dehydrated).   Medicines (antibiotics) that kill germs may be given.   Pain and nausea medicine, if needed.   As you improve, eating soft, easily digestible foods.   Surgery if the inflamed diverticulum has burst.  HOME CARE INSTRUCTIONS  Take all medicine as directed by your caregiver.   Try a clear liquid diet (broth, tea, or water for , or as directed by your caregiver). You  may then gradually begin a bland diet as tolerated.   You may be put on a high-fiber diet. Avoid nuts and seeds. Follow your caregiver's diet recommendations.   If your caregiver has given you a follow-up appointment, it is very important that you go. Not going could result in lasting (chronic) or permanent injury, pain, and disability. If there is any problem keeping the appointment, call to reschedule.  SEEK MEDICAL CARE IF:  Pain does not improve.   You have a hard time advancing your diet beyond clear liquids.   Bowel movements do not return to normal.  SEEK IMMEDIATE MEDICAL CARE IF:  The pain becomes worse.   Repeated vomiting occurs.   You have bloody or black, tarry stools.   Symptoms that brought you to your caregiver become worse or are not getting better.  MAKE SURE YOU:  Understand these instructions.   Will watch your condition.   Will get help right away if you are not doing well or get worse.  Document Released: 08/24/2005 Document Re-Released: 02/08/2010 [facility] Patient Information 2011 [place], [place].

## 2011-07-07 NOTE — Progress Notes (Signed)
History of Present Illness:  This is an extremely pleasant 65 year old Caucasian female who is status post a hysterectomy and left ovarian removal but calls of uterine carcinoma. She underwent chemotherapy and mid 2011, followed by surgery, followed by radiation treatments which were completed in May. She apparently developed anemia during her treatment process with followup hemoglobin on July 20 of 8.9, hematocrit 27, MCV 102, and platelet count 105,000. She'll hold iron replacement therapy.  Patient denies any gastrointestinal complaints at this time. She does have occasional acid reflux is on Prilosec 20 mg a day. Part of her workup she has also been found to have cholelithiasis, but denies any hepatobiliary complaints. She has alternating diarrhea and constipation with when necessary Senokot use, when necessary Zofran, and when necessary Percocet. She does take and he and 10 mg at bedtime.  She denies current nausea vomiting, abdominal pain, melena or hematochezia. Family history is noncontributory. She has not had previous endoscopy or colonoscopy. As of note that the patient had CT scan documented diverticulitis in to March 20 12th that responded to antibiotic therapy. At the time of her gynecologic surgery she did not have a partial colectomy. There is no history of pancreatitis, hepatitis, recurrent hepatobiliary complaints, but she does have asymptomatic gallstones.  I have reviewed this patient's present history, medical and surgical past history, allergies and medications.     ROS: No current cardiovascular or pulmonary complaints urologic, neurologic, or gynecologic issues at this time. The remainder of the 10 point ROS is negative     Physical Exam: Awake and alert no acute distress appearing her stated age. I cannot appreciate stigmata of chronic liver disease. Her chest is clear without wheezes or rhonchi. She appears to be a regular rhythm without murmurs gallops or rubs. There is no  organomegaly, abdominal masses or tenderness. Bowel sounds are normal. Rectal exam is deferred at this time.  Peripheral extremities are unremarkable. Mental status is normal without gross neurological deficits.    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 She has had previous transfusions. She has rather persistent acid reflux that was originally improved totally with AcipHex therapy, but she currently is on Prilosec 20 mg a day because of cost constraints. She has no current symptoms of diverticulitis, but she is due for screening colonoscopy, also endoscopic exam to exclude Barrett's mucosa from her chronic GERD. I have ordered iron folate, and B12 levels today, I have substituted AcipHex 20 mg a day for Prilosec, and given her multiple samples. The patient does have morbid obesity, but is been able to lose voluntarily over 20 pounds in weight. Review of the CT scan does show rather severe left colon diverticulosis and asymptomatic gallstones. She has poor venous access and has a Port-A-Cath in her right chest area. Her procedures will have to be done at the hospital because of her Port-A-Cath... the risk and benefits of these procedures have been explained in detail and she is agreed to proceed as planned. She otherwise is to continue her medications per her multiple physicians.  Please copy her primary care physician, referring physician, and pertinent subspecialists.  Encounter Diagnoses  Name Primary?  . Esophageal reflux   . Diverticulosis of colon (without mention of hemorrhage)   . Anemia

## 2011-07-08 ENCOUNTER — Telehealth: Payer: Self-pay | Admitting: Gastroenterology

## 2011-07-08 DIAGNOSIS — D649 Anemia, unspecified: Secondary | ICD-10-CM

## 2011-07-08 MED ORDER — PANTOPRAZOLE SODIUM 40 MG PO TBEC: 40.0000 mg | DELAYED_RELEASE_TABLET | Freq: Every day | ORAL | Status: DC

## 2011-07-08 NOTE — Telephone Encounter (Signed)
I have changed to protonix which per our pc looks cheaper. ECL at [facility] on 08/04/2011 with Dr Rhea Belton. Pt advised of all appts and when she arrives for previsit we will give her a prep.

## 2011-07-14 ENCOUNTER — Ambulatory Visit: Payer: Medicare PPO | Attending: Gynecologic Oncology | Admitting: Gynecologic Oncology

## 2011-07-14 DIAGNOSIS — Z9221 Personal history of antineoplastic chemotherapy: Secondary | ICD-10-CM | POA: Insufficient documentation

## 2011-07-14 DIAGNOSIS — C549 Malignant neoplasm of corpus uteri, unspecified: Secondary | ICD-10-CM | POA: Insufficient documentation

## 2011-07-14 DIAGNOSIS — Z90710 Acquired absence of both cervix and uterus: Secondary | ICD-10-CM | POA: Insufficient documentation

## 2011-07-14 DIAGNOSIS — Z9079 Acquired absence of other genital organ(s): Secondary | ICD-10-CM | POA: Insufficient documentation

## 2011-07-14 DIAGNOSIS — Z923 Personal history of irradiation: Secondary | ICD-10-CM | POA: Insufficient documentation

## 2011-07-19 NOTE — Consult Note (Signed)
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  HISTORY OF PRESENT ILLNESS:  This is a 65 year old who reported to Dr. Penne Lash in December 2011 with a 78-month history of uterine bleeding, the endometrial biopsy demonstrated presence of a grade 1 endometrial cancer.  There was initial plan for hysterectomy with lymph node dissection.  However, the patient noted to have tumor that rapidly progressed and involved the cervix.  The PET scan demonstrated uterine cancer with significant cervical involvement.  She received 3 cycles of neoadjuvant Taxol, carboplatin therapy, then underwent a robotic- assisted laparoscopic hysterectomy, bilateral salpingo-oophorectomy, and lymph node sampling.  Final pathology demonstrated invasive endometrioid adenocarcinoma involving the outer half of the myometrium and involving the cervical stroma, angiolymphatic invasion was present and the tumor was noted to be FIGO grade II.  She subsequently received additional 3 cycles of adjuvant Taxol, carboplatin therapy, completed in March 2012. Based on the positive lymph nodes, she received external beam radiation therapy for a dose to the pelvis area 400-500 centigrade.  She then underwent intracavitary brachytherapy, which was completed in June 2012. The cumulative dose to the proximal vagina was 600-300 centigrade. Postoperatively, Ms. [name] has done very well.  PAST MEDICAL HISTORY:  Morbid obesity with recent 40-pound weight loss, stage III C endometrial cancer, and diverticulosis.  PAST SURGICAL HISTORY:  Bilateral ovarian wedge resection in 1973, robotic-laparoscopic  hysterectomy, bilateral salpingo-oophorectomy, and lymph node sampling in December 2011.  REVIEW OF SYSTEMS:  No nausea, vomiting, fever, chills, or abdominal pain.  No diarrhea, constipation, or lower extremity edema.  No hematochezia, hematocolpos, or hematuria.  Otherwise 10-point review of systems negative.  SCREENING HISTORY:  Upper endoscopy and colonoscopy scheduled for September 2012.  Mammogram in July 2012 without any abnormalities.  PHYSICAL EXAMINATION:  GENERAL:  Well-developed female in no acute distress, weight 250 pounds. VITAL SIGNS:  Blood pressure 102/64, pulse 78, and temperature 98. CHEST:  Clear to auscultation. HEART:  Regular rate and rhythm. LYMPH NODE SURVEY:  No cervical, supraclavicular, or inguinal adenopathy. ABDOMEN:  Soft, obese, nontender. EXTREMITIES:  No clubbing, cyanosis, or edema. PELVIC:  Normal external genitalia, Bartholin's, urethral and Skene's. Atrophic vagina.  Small amount of vaginal discharge. RECTAL:  Good tone.  No cul-de-sac masses.  No palpable lesions in the rectovaginal septum.  IMPRESSION:  Ms. [name] is a 65 year old without any evidence of disease from her stage III C endometrial cancer.  It is now 2 months since completion of her radiation therapy as such a Pap test was not collected today.  I hope that Dr. Roselind Messier will be able to repeat this Pap at her subsequent visit with him.  Signs and symptoms of recurrent disease were discussed with the patient in addition to the importance of close follow- up.  PLAN:  To follow up with Dr. Roselind Messier in November 2012; with Dr. Darrold Span in March 2013, and with GYN/Oncology in May 2013.     Laurette Schimke, MD     WB/MEDQ  D:  07/14/2011  T:  07/15/2011  Job:  [id number]  cc:   Lesly Dukes, M.D.  Reece Packer, M.D. Fax: [phone]  Billie Lade, Ph.D., M.D. Fax: [phone]  Nani Gasser, M.D. Fax: [phone]  Telford Nab, R.N. [address] [place],  [place] [address]  Electronically Signed by Laurette Schimke MD on 07/19/2011 07:10:59 AM

## 2011-07-22 ENCOUNTER — Other Ambulatory Visit: Payer: Self-pay | Admitting: Family Medicine

## 2011-07-26 ENCOUNTER — Ambulatory Visit (AMBULATORY_SURGERY_CENTER): Payer: Medicare PPO | Admitting: *Deleted

## 2011-07-26 VITALS — Ht 65.0 in | Wt 246.0 lb

## 2011-07-26 DIAGNOSIS — K5730 Diverticulosis of large intestine without perforation or abscess without bleeding: Secondary | ICD-10-CM

## 2011-07-26 DIAGNOSIS — D649 Anemia, unspecified: Secondary | ICD-10-CM

## 2011-07-26 DIAGNOSIS — K219 Gastro-esophageal reflux disease without esophagitis: Secondary | ICD-10-CM

## 2011-07-27 ENCOUNTER — Telehealth: Payer: Self-pay | Admitting: Family Medicine

## 2011-07-27 NOTE — Telephone Encounter (Signed)
[name] with [facility] called and wanted the provider to know that he would be discharging the pt from home health services today regarding pt's depression.  Pt. Is doing well. PLAN:  Routed this message to the provider.   [name], LPN [name]

## 2011-08-04 ENCOUNTER — Ambulatory Visit (HOSPITAL_COMMUNITY)
Admission: RE | Admit: 2011-08-04 | Discharge: 2011-08-04 | Disposition: A | Discharge status: Home / Self Care | Payer: Medicare PPO | Source: Ambulatory Visit | Attending: Internal Medicine | Admitting: Internal Medicine

## 2011-08-04 ENCOUNTER — Other Ambulatory Visit: Payer: Self-pay | Admitting: Internal Medicine

## 2011-08-04 ENCOUNTER — Encounter: Payer: Medicare PPO | Admitting: Internal Medicine

## 2011-08-04 DIAGNOSIS — Z79899 Other long term (current) drug therapy: Secondary | ICD-10-CM | POA: Insufficient documentation

## 2011-08-04 DIAGNOSIS — K644 Residual hemorrhoidal skin tags: Secondary | ICD-10-CM | POA: Insufficient documentation

## 2011-08-04 DIAGNOSIS — Z1211 Encounter for screening for malignant neoplasm of colon: Secondary | ICD-10-CM | POA: Insufficient documentation

## 2011-08-04 DIAGNOSIS — K648 Other hemorrhoids: Secondary | ICD-10-CM | POA: Insufficient documentation

## 2011-08-04 DIAGNOSIS — K5730 Diverticulosis of large intestine without perforation or abscess without bleeding: Secondary | ICD-10-CM | POA: Insufficient documentation

## 2011-08-04 DIAGNOSIS — K5289 Other specified noninfective gastroenteritis and colitis: Secondary | ICD-10-CM | POA: Insufficient documentation

## 2011-08-04 DIAGNOSIS — Z90710 Acquired absence of both cervix and uterus: Secondary | ICD-10-CM | POA: Insufficient documentation

## 2011-08-04 DIAGNOSIS — K2960 Other gastritis without bleeding: Secondary | ICD-10-CM | POA: Insufficient documentation

## 2011-08-04 DIAGNOSIS — K449 Diaphragmatic hernia without obstruction or gangrene: Secondary | ICD-10-CM | POA: Insufficient documentation

## 2011-08-08 ENCOUNTER — Encounter: Payer: Self-pay | Admitting: Internal Medicine

## 2011-08-10 ENCOUNTER — Encounter: Payer: Self-pay | Admitting: Gastroenterology

## 2011-08-12 ENCOUNTER — Other Ambulatory Visit: Payer: Self-pay | Admitting: Oncology

## 2011-08-12 ENCOUNTER — Encounter (HOSPITAL_BASED_OUTPATIENT_CLINIC_OR_DEPARTMENT_OTHER): Payer: Medicare PPO | Admitting: Oncology

## 2011-08-12 DIAGNOSIS — E876 Hypokalemia: Secondary | ICD-10-CM

## 2011-08-12 DIAGNOSIS — Z5111 Encounter for antineoplastic chemotherapy: Secondary | ICD-10-CM

## 2011-08-12 DIAGNOSIS — C549 Malignant neoplasm of corpus uteri, unspecified: Secondary | ICD-10-CM

## 2011-08-12 DIAGNOSIS — Z452 Encounter for adjustment and management of vascular access device: Secondary | ICD-10-CM

## 2011-08-12 LAB — CBC WITH DIFFERENTIAL/PLATELET
BASO%: 0.2 % (ref 0.0–2.0)
EOS%: 2 % (ref 0.0–7.0)
HCT: 28.4 % — ABNORMAL LOW (ref 34.8–46.6)
MCH: 33.6 pg (ref 25.1–34.0)
MCHC: 33.9 g/dL (ref 31.5–36.0)
NEUT%: 70.6 % (ref 38.4–76.8)
RBC: 2.87 10*6/uL — ABNORMAL LOW (ref 3.70–5.45)
RDW: 15.9 % — ABNORMAL HIGH (ref 11.2–14.5)
lymph#: 0.8 10*3/uL — ABNORMAL LOW (ref 0.9–3.3)

## 2011-08-12 LAB — COMPREHENSIVE METABOLIC PANEL
ALT: 12 U/L (ref 0–35)
AST: 10 U/L (ref 0–37)
Calcium: 9 mg/dL (ref 8.4–10.5)
Chloride: 106 mEq/L (ref 96–112)
Creatinine, Ser: 0.92 mg/dL (ref 0.50–1.10)

## 2011-08-22 ENCOUNTER — Ambulatory Visit (INDEPENDENT_AMBULATORY_CARE_PROVIDER_SITE_OTHER): Payer: Medicare PPO | Admitting: Family Medicine

## 2011-08-22 ENCOUNTER — Encounter: Payer: Self-pay | Admitting: Family Medicine

## 2011-08-22 VITALS — BP 124/78 | HR 90 | Wt 245.0 lb

## 2011-08-22 DIAGNOSIS — F329 Major depressive disorder, single episode, unspecified: Secondary | ICD-10-CM

## 2011-08-22 DIAGNOSIS — Z13220 Encounter for screening for lipoid disorders: Secondary | ICD-10-CM

## 2011-08-22 DIAGNOSIS — G44209 Tension-type headache, unspecified, not intractable: Secondary | ICD-10-CM

## 2011-08-22 DIAGNOSIS — Z23 Encounter for immunization: Secondary | ICD-10-CM

## 2011-08-22 MED ORDER — BUTALBITAL-ACETAMINOPHEN 50-650 MG PO TABS: 1.0000 | ORAL_TABLET | Freq: Every day | ORAL | Status: DC | PRN

## 2011-08-22 NOTE — Progress Notes (Signed)
  Subjective:    Patient ID: [name], female    DOB: [birth year], 65 y.o.   MRN: [id number]  HPI  She is now on pantoprozole and says working better than the aciphex. Just had EGD and colonoscopy  In early Sept.  she said she got a good report back from both. She wants to know how long she should asked to take the pantoprazole because only had 2 refills on the bottle.  Depression - Wants to continue her fluoxoetine. Feels it is really helping her.  Husband reacently ill and this was very stressful for her. She said she doesn't know what she would've done it without the medication. He is currently in rehabilitation facility. He will be coming home and will be there full time as he will be unable to work. Unfortunately I think this will be even more stressful for her. She will be getting some home health services.  Getting stress heaches.  _ has been using her roxicet but says it is really too strong so really only takes half a tab. She says she takes a whole tablet she sleeps the rest of the day. Wants something less potent.  He says the headaches tend to start in the back of her head and then come up to the top of her head. It usually happens a tense moments when she feels overwhelmed and stressed. No other alleviating medications her symptoms. She does not take any NSAIDs.  Review of Systems     Objective:   Physical Exam  Constitutional: She appears well-developed and well-nourished.  HENT:  Head: Normocephalic and atraumatic.  Cardiovascular: Normal rate, regular rhythm and normal heart sounds.   Pulmonary/Chest: Effort normal and breath sounds normal.  Skin: Skin is warm and dry.  Psychiatric: She has a normal mood and affect. Her behavior is normal.          Assessment & Plan:  Tension HA-I explained we typically don't use narcotics for tension headaches even though yes I agree with her that they probably do work well. I would like to start with Bupap type product first. If this  does not seem to improve her tension headache along with hopefully getting her mood under better control then we might consider some low-dose hydrocodone for when necessary use only. Physical therapy can also be helpful for tension headaches as well.  Depression - Doing well on her fluoxetine. Continue current med. She was to continue her medication as she feels it is really benefiting her. Followup in 4 months.  GERD-discussed the dietary changes that are needed to help keep her GERD under control. Weight loss will also help. Currently she can get back into some regular exercise. I recommended she refilled the pantoprazole for one more month and at that point try going to every other day. That prescription should the last 2 months and if she does well at that point she contracting every third day for symptom control. I did explain that which would avoid prolonged use because it can increase risk of thinning bones and fracture.  2 vaccine given.  She needs a screening cholesterol is one has not been done the last 12 months.

## 2011-08-23 NOTE — Progress Notes (Signed)
Addended by: Wyline Beady on: 08/23/2011 01:29 PM   Modules accepted: Orders

## 2011-09-23 ENCOUNTER — Encounter (HOSPITAL_BASED_OUTPATIENT_CLINIC_OR_DEPARTMENT_OTHER): Payer: Medicare PPO | Admitting: Oncology

## 2011-09-23 DIAGNOSIS — C549 Malignant neoplasm of corpus uteri, unspecified: Secondary | ICD-10-CM

## 2011-10-17 ENCOUNTER — Encounter: Payer: Self-pay | Admitting: *Deleted

## 2011-10-17 ENCOUNTER — Ambulatory Visit: Payer: Medicare PPO | Admitting: Radiation Oncology

## 2011-10-25 ENCOUNTER — Encounter: Payer: Self-pay | Admitting: *Deleted

## 2011-10-25 DIAGNOSIS — Z923 Personal history of irradiation: Secondary | ICD-10-CM | POA: Insufficient documentation

## 2011-10-31 ENCOUNTER — Ambulatory Visit
Admission: RE | Admit: 2011-10-31 | Discharge: 2011-10-31 | Disposition: A | Discharge status: Home / Self Care | Payer: Medicare PPO | Source: Ambulatory Visit | Attending: Radiation Oncology | Admitting: Radiation Oncology

## 2011-10-31 ENCOUNTER — Encounter: Payer: Self-pay | Admitting: Radiation Oncology

## 2011-10-31 VITALS — BP 127/80 | HR 80 | Temp 97.7°F | Resp 20 | Wt 238.5 lb

## 2011-10-31 DIAGNOSIS — C55 Malignant neoplasm of uterus, part unspecified: Secondary | ICD-10-CM

## 2011-10-31 DIAGNOSIS — C541 Malignant neoplasm of endometrium: Secondary | ICD-10-CM

## 2011-10-31 DIAGNOSIS — Z923 Personal history of irradiation: Secondary | ICD-10-CM

## 2011-10-31 NOTE — Progress Notes (Signed)
Last mammogram 06/14/11, egd and coplonoscopy  Done  After mammogram, no polyps, diverticulitis and diverticulosis found, no c/o pain, watching her diet more, pt adopted a cat recently and thinks allergy from the kitten,didn't  Have pap smear with Dr. Nelly Rout in August,   11:20 AM

## 2011-11-01 NOTE — Progress Notes (Signed)
CC:   Pamela A. Duard Brady, MD [name], M.D. [name], M.D.  DIAGNOSIS:  Endometrial cancer.  INTERVAL SINCE RADIATION THERAPY:  6 months.  NARRATIVE:  Mrs. Flow comes in today for routine followup.  She clinically seems to be doing quite well at this time.  Her affect is quite good today.  The patient is pleased to have lost 9 pounds working with the nutrition support services here in the [facility].  The patient denies any pelvic pain, low back pain, vaginal bleeding, urination difficulties, or bowel complaints.  The patient was diagnosed with diverticulitis and subsequently is on a diet concerning this issue. The patient continues to use her vaginal dilator as recommended.  PHYSICAL EXAMINATION:  Vital Signs:  The patient's weight is 238 pounds. Pulse 88, blood pressure is 127/80, temperature 97.7.  Examination of the neck and supraclavicular region reveals no evidence of adenopathy. The axillary areas are free of adenopathy.  Examination of the lungs reveals them to be clear.  Heart:  Regular rhythm and rate.  Examination of the abdomen reveals it to be soft and nontender with normal bowel sounds.  There is no inguinal adenopathy appreciated.  On pelvic examination, the external genitalia are unremarkable.  A speculum exam was performed.  There are mild radiation changes noted at the vaginal cuff.  A Pap smear was obtained of the proximal vagina.  On bimanual and rectovaginal examination, there are no pelvic masses appreciated.  IMPRESSION AND PLAN:  Clinically no evidence of disease.  Pap smear pending.  The patient will return for routine followup in 6 months and in the interim will see Dr. Duard Carlson and Dr. Darrold Span.    ______________________________ Billie Lade, Ph.D., M.D. JDK/MEDQ  D:  10/31/2011  T:  11/01/2011  Job:  [id number]

## 2011-11-02 ENCOUNTER — Telehealth: Payer: Self-pay | Admitting: Oncology

## 2011-11-02 NOTE — Telephone Encounter (Signed)
Added appts for jan/march (mosaiq) s/w pt she will get schedule when she comes in 12/7.

## 2011-11-04 ENCOUNTER — Telehealth: Payer: Self-pay | Admitting: Oncology

## 2011-11-04 ENCOUNTER — Ambulatory Visit (HOSPITAL_BASED_OUTPATIENT_CLINIC_OR_DEPARTMENT_OTHER): Payer: Medicare PPO

## 2011-11-04 DIAGNOSIS — C549 Malignant neoplasm of corpus uteri, unspecified: Secondary | ICD-10-CM

## 2011-11-04 DIAGNOSIS — Z452 Encounter for adjustment and management of vascular access device: Secondary | ICD-10-CM

## 2011-11-04 DIAGNOSIS — C55 Malignant neoplasm of uterus, part unspecified: Secondary | ICD-10-CM

## 2011-11-04 MED ORDER — SODIUM CHLORIDE 0.9 % IJ SOLN
10.0000 mL | INTRAMUSCULAR | Status: DC | PRN
  Administered 2011-11-04: 10 mL via INTRAVENOUS
  Filled 2011-11-04: qty 10

## 2011-11-04 MED ORDER — HEPARIN SOD (PORK) LOCK FLUSH 100 UNIT/ML IV SOLN
500.0000 [IU] | Freq: Once | INTRAVENOUS | Status: AC
  Administered 2011-11-04: 500 [IU] via INTRAVENOUS
  Filled 2011-11-04: qty 5

## 2011-11-04 NOTE — Telephone Encounter (Signed)
gve the pt her jan,march 2013 appt calendar °

## 2011-11-07 ENCOUNTER — Ambulatory Visit: Payer: Medicare PPO | Admitting: Radiation Oncology

## 2011-11-08 NOTE — Progress Notes (Signed)
CC:   Laurette Schimke, MD  NARRATIVE:  Last week, Ms. Gorby underwent pelvic exam and Pap smear. The patient's Pap smear showed atypical glandular cells.  I reviewed this with Dr. Nelly Rout, and it is recommended that the patient proceed with colposcopy.  This will be scheduled in the near future.  In addition, the patient was informed of these abnormal findings today.    ______________________________ Billie Lade, Ph.D., M.D. JDK/MEDQ  D:  11/08/2011  T:  11/08/2011  Job:  [id number]

## 2011-11-09 ENCOUNTER — Other Ambulatory Visit (HOSPITAL_COMMUNITY)
Admission: RE | Admit: 2011-11-09 | Discharge: 2011-11-09 | Disposition: A | Discharge status: Home / Self Care | Payer: Medicare PPO | Source: Ambulatory Visit | Attending: Radiation Oncology | Admitting: Radiation Oncology

## 2011-11-09 DIAGNOSIS — Z01419 Encounter for gynecological examination (general) (routine) without abnormal findings: Secondary | ICD-10-CM | POA: Insufficient documentation

## 2011-11-16 ENCOUNTER — Encounter: Payer: Self-pay | Admitting: Family Medicine

## 2011-11-24 ENCOUNTER — Ambulatory Visit: Payer: Medicare PPO | Admitting: Family Medicine

## 2011-12-02 ENCOUNTER — Ambulatory Visit (INDEPENDENT_AMBULATORY_CARE_PROVIDER_SITE_OTHER): Payer: Medicare PPO | Admitting: Family Medicine

## 2011-12-02 ENCOUNTER — Telehealth: Payer: Self-pay | Admitting: Family Medicine

## 2011-12-02 ENCOUNTER — Encounter: Payer: Self-pay | Admitting: Family Medicine

## 2011-12-02 VITALS — BP 128/76 | HR 83 | Wt 241.0 lb

## 2011-12-02 DIAGNOSIS — F32A Depression, unspecified: Secondary | ICD-10-CM

## 2011-12-02 DIAGNOSIS — K219 Gastro-esophageal reflux disease without esophagitis: Secondary | ICD-10-CM

## 2011-12-02 DIAGNOSIS — R631 Polydipsia: Secondary | ICD-10-CM

## 2011-12-02 DIAGNOSIS — F329 Major depressive disorder, single episode, unspecified: Secondary | ICD-10-CM

## 2011-12-02 DIAGNOSIS — D509 Iron deficiency anemia, unspecified: Secondary | ICD-10-CM

## 2011-12-02 LAB — CBC
HCT: 31.6 % — ABNORMAL LOW (ref 36.0–46.0)
MCV: 97.2 fL (ref 78.0–100.0)
Platelets: 154 10*3/uL (ref 150–400)
RBC: 3.25 MIL/uL — ABNORMAL LOW (ref 3.87–5.11)
RDW: 16.6 % — ABNORMAL HIGH (ref 11.5–15.5)
WBC: 4.4 10*3/uL (ref 4.0–10.5)

## 2011-12-02 MED ORDER — AMBULATORY NON FORMULARY MEDICATION: Status: DC

## 2011-12-02 MED ORDER — ZOLPIDEM TARTRATE 10 MG PO TABS: 10.0000 mg | ORAL_TABLET | Freq: Every evening | ORAL | Status: DC | PRN

## 2011-12-02 MED ORDER — PANTOPRAZOLE SODIUM 40 MG PO TBEC: 40.0000 mg | DELAYED_RELEASE_TABLET | Freq: Every day | ORAL | Status: DC

## 2011-12-02 MED ORDER — FLUOXETINE HCL 20 MG PO CAPS: 40.0000 mg | ORAL_CAPSULE | Freq: Every day | ORAL | Status: DC

## 2011-12-02 NOTE — Telephone Encounter (Signed)
Please call Dr. Darrold Span office and see if they have an up-to-date tetanus or shingles vaccine for her.

## 2011-12-02 NOTE — Progress Notes (Signed)
  Subjective:    Patient ID: [name], female    DOB: [birth year], 66 y.o.   MRN: [id number]  HPI WAnts her sugar checked as she has been craving cold water. She was unable to go for her labs so needs to repint her orders.  Husband was back in the hosptial so couldn't go for her labs. She is also due for lipid check.  GERD- She has been able to space her PPI to every other day. Says every 3rd days causes symptoms. We discussed her last visit trying to wean the PPI because of increased risk of fractures.  Depression - Says taking 1 tab QOD and 2 tabs QOD.  She feels this is working well for her. She eventually wants to wean herself off this medication but it has been stressful lately with her husband helps her she would like to continue it for now. She has had several changes to her medication list so we have dated this.  Review of Systems     Objective:   Physical Exam  Constitutional: She is oriented to person, place, and time. She appears well-developed and well-nourished.  HENT:  Head: Normocephalic and atraumatic.  Eyes: Conjunctivae are normal. Pupils are equal, round, and reactive to light.  Cardiovascular: Normal rate and regular rhythm.        2/6 systolic ejection murmur.  Pulmonary/Chest: Effort normal and breath sounds normal.  Musculoskeletal: She exhibits no edema.  Neurological: She is alert and oriented to person, place, and time.  Skin: Skin is warm and dry.  Psychiatric: She has a normal mood and affect. Her behavior is normal.          Assessment & Plan:  Increased thirst-we will check a glucose. She is also due for lipid check.  GERD-she is doing well on her PPI every other day. After she's been on this regimen for a couple months she can try going to every third day again.  Depression-patient is doing well on her current regimen. I did refill her medication and she will followup in 4 months.  I do not have an updated tetanus for her. This she's reports  that Dr. Darrold Span gave her multiple vaccine years ago. We'll try to contact the office and had her records updated.

## 2011-12-03 LAB — LIPID PANEL
Total CHOL/HDL Ratio: 5.1 Ratio
VLDL: 36 mg/dL (ref 0–40)

## 2011-12-08 NOTE — Telephone Encounter (Signed)
Please call pt and let her know so we can make sure she gets back in for hre Tdap. We can give rx for shingles.

## 2011-12-08 NOTE — Telephone Encounter (Signed)
They have no record of tdap and they dont do shingles vaccines

## 2011-12-09 NOTE — Telephone Encounter (Signed)
LM for pt to returncall

## 2011-12-13 ENCOUNTER — Telehealth: Payer: Self-pay | Admitting: Nutrition

## 2011-12-13 NOTE — Telephone Encounter (Signed)
I returned a call from patient.  Patient is requesting I mail her a copy of a calorie controlled diet and a list of high protein foods.  She has misplaced the ones I have previously given her.  I have mailed this information to her today.  Patient reports a desire for follow up soon and states she will make a follow up appointment with me.

## 2011-12-16 ENCOUNTER — Ambulatory Visit (HOSPITAL_BASED_OUTPATIENT_CLINIC_OR_DEPARTMENT_OTHER): Payer: Medicare PPO

## 2011-12-16 DIAGNOSIS — C55 Malignant neoplasm of uterus, part unspecified: Secondary | ICD-10-CM

## 2011-12-16 MED ORDER — SODIUM CHLORIDE 0.9 % IJ SOLN
10.0000 mL | INTRAMUSCULAR | Status: DC | PRN
  Administered 2011-12-16: 10 mL via INTRAVENOUS
  Filled 2011-12-16: qty 10

## 2011-12-16 MED ORDER — HEPARIN SOD (PORK) LOCK FLUSH 100 UNIT/ML IV SOLN
500.0000 [IU] | Freq: Once | INTRAVENOUS | Status: AC
  Administered 2011-12-16: 500 [IU] via INTRAVENOUS
  Filled 2011-12-16: qty 5

## 2011-12-22 ENCOUNTER — Encounter: Payer: Self-pay | Admitting: Gynecologic Oncology

## 2011-12-23 ENCOUNTER — Ambulatory Visit: Payer: Medicare PPO | Attending: Gynecology | Admitting: Gynecology

## 2011-12-23 ENCOUNTER — Encounter: Payer: Self-pay | Admitting: Gynecology

## 2011-12-23 VITALS — BP 118/66 | HR 72 | Temp 98.1°F | Resp 18 | Ht 63.19 in | Wt 238.0 lb

## 2011-12-23 DIAGNOSIS — Z79899 Other long term (current) drug therapy: Secondary | ICD-10-CM | POA: Insufficient documentation

## 2011-12-23 DIAGNOSIS — Z923 Personal history of irradiation: Secondary | ICD-10-CM | POA: Insufficient documentation

## 2011-12-23 DIAGNOSIS — C549 Malignant neoplasm of corpus uteri, unspecified: Secondary | ICD-10-CM | POA: Insufficient documentation

## 2011-12-23 DIAGNOSIS — C541 Malignant neoplasm of endometrium: Secondary | ICD-10-CM

## 2011-12-23 DIAGNOSIS — E669 Obesity, unspecified: Secondary | ICD-10-CM | POA: Insufficient documentation

## 2011-12-23 DIAGNOSIS — Z9221 Personal history of antineoplastic chemotherapy: Secondary | ICD-10-CM | POA: Insufficient documentation

## 2011-12-23 NOTE — Patient Instructions (Signed)
We will call you with the biopsy report. 

## 2011-12-23 NOTE — Progress Notes (Signed)
Consult Note: Gyn-Onc   [name] 66 y.o. female  Chief Complaint  Patient presents with  . Endometrial cancer    follow-up    Interval History: The patient recently had a Pap smear performed in radiation oncology at that returned showing atypical glandular cells. She denies any vaginal bleeding or discharge or any other pelvic symptoms including pain.  HPI: Stage IIIC endometrial carcinoma. Initially she had bulky tumor in the cervix and the patient was initially treated with neoadjuvant chemotherapy (3 cycles of Taxol and carboplatin) she then underwent a robotic hysterectomy, bilateral salpingo-oophorectomy and pelvic lymphadenectomy. Final pathology showed invasive grade 2 endometrial carcinoma invading the outer half of the myometrium and the cervical stroma and she had positive lymph nodes. Postoperatively she received 3 cycles of Taxol and carboplatin completed in March 2012. She also received external beam radiation therapy to the whole pelvis and vaginal vault brachytherapy. She apparently tolerated all this treatment well.  No Known Allergies  Past Medical History  Diagnosis Date  . Back pain   . Lower extremity weakness   . Diverticulosis April 2012  . Depression   . Uterine cancer 2011    endometrialca  . Hx of radiation therapy 03/08/11-04/18/11& 5/29,6/04/03/11/12    external beam and intracavity brachytherapy  . Anxiety   . Arthritis     degenerative  . Obesity   . Anemia     multifactorial,r/t chemo and radiation also  . History of mammogram 06/14/11    b/l mammogram    Past Surgical History  Procedure Date  . Portacath placement Jan 2012  . Abdominal hysterectomy 2011    Current Outpatient Prescriptions  Medication Sig Dispense Refill  . Cholecalciferol (VITAMIN D3) 1000 UNITS CAPS Take 2,000 Units by mouth daily.        Marland Kitchen FLUoxetine (PROZAC) 20 MG capsule Take 2 capsules (40 mg total) by mouth daily.  30 capsule  6  . Multiple Vitamin (MULTIVITAMIN)  capsule Take 1 capsule by mouth daily.        . pantoprazole (PROTONIX) 40 MG tablet Take 1 tablet (40 mg total) by mouth daily.  30 tablet  6  . senna-docusate (SENOKOT-S) 8.6-50 MG per tablet Take 1 tablet by mouth daily as needed.        . vitamin B-12 (CYANOCOBALAMIN) 1000 MCG tablet Take 1,000 mcg by mouth daily.        Marland Kitchen zolpidem (AMBIEN) 10 MG tablet Take 1 tablet (10 mg total) by mouth at bedtime as needed.  30 tablet  3  . AMBULATORY NON FORMULARY MEDICATION Medication Name: zostavax IM x 1  1 vial  0    History   Social History  . Marital Status: Married    Spouse Name: N/A    Number of Children: N/A  . Years of Education: N/A   Occupational History  . Not on file.   Social History Main Topics  . Smoking status: Never Smoker   . Smokeless tobacco: Never Used  . Alcohol Use: No  . Drug Use: No  . Sexually Active: Not on file   Other Topics Concern  . Not on file   Social History Narrative  . No narrative on file    Family History  Problem Relation Age of Onset  . Lung cancer Mother   . Pulmonary fibrosis Mother   . Heart attack Father   . Diabetes Father   . Colon cancer Neg Hx   . Rectal cancer Neg Hx   .  Stomach cancer Neg Hx     Review of Systems: 10 point review of systems negative except as noted above  Vitals: Blood pressure 118/66, pulse 72, temperature 98.1 F (36.7 C), temperature source Oral, resp. rate 18, height 5' 3.19" (1.605 m), weight 238 lb (107.956 kg).  Physical Exam: General the patient is a healthy white female no acute distress  The abdomen is soft nontender no masses organomegaly or ascites noted  Pelvic exam EGBUS vagina bladder urethra are normal except for some radiation changes in the upper vagina. Bimanual exam reveals no nodularity masses or tenderness.  Procedure note colposcopic examination of the entire vagina was performed using acetic acid, a white light, and green filter. There is an area of inflammation at the apex of  the vagina most likely secondary to vaginal brachytherapy. A biopsy is obtained.  Assessment/Plan: Stage IIIC endometrial carcinoma Recent atypical glandular cells on Pap smear.  We will await biopsy reports before making any further recommendations. If the biopsy is not show any evidence of cancer we'll have the patient return in 3 months for routine followup.   Jeannette Corpus, MD 12/23/2011, 1:13 PM                         Consult Note: Gyn-Onc   [name] 66 y.o. female  Chief Complaint  Patient presents with  . Endometrial cancer    follow-up    Interval History:   HPI:  No Known Allergies  Past Medical History  Diagnosis Date  . Back pain   . Lower extremity weakness   . Diverticulosis April 2012  . Depression   . Uterine cancer 2011    endometrialca  . Hx of radiation therapy 03/08/11-04/18/11& 5/29,6/04/03/11/12    external beam and intracavity brachytherapy  . Anxiety   . Arthritis     degenerative  . Obesity   . Anemia     multifactorial,r/t chemo and radiation also  . History of mammogram 06/14/11    b/l mammogram    Past Surgical History  Procedure Date  . Portacath placement Jan 2012  . Abdominal hysterectomy 2011    Current Outpatient Prescriptions  Medication Sig Dispense Refill  . Cholecalciferol (VITAMIN D3) 1000 UNITS CAPS Take 2,000 Units by mouth daily.        Marland Kitchen FLUoxetine (PROZAC) 20 MG capsule Take 2 capsules (40 mg total) by mouth daily.  30 capsule  6  . Multiple Vitamin (MULTIVITAMIN) capsule Take 1 capsule by mouth daily.        . pantoprazole (PROTONIX) 40 MG tablet Take 1 tablet (40 mg total) by mouth daily.  30 tablet  6  . senna-docusate (SENOKOT-S) 8.6-50 MG per tablet Take 1 tablet by mouth daily as needed.        . vitamin B-12 (CYANOCOBALAMIN) 1000 MCG tablet Take 1,000 mcg by mouth daily.        Marland Kitchen zolpidem (AMBIEN) 10 MG tablet Take 1 tablet (10 mg total) by mouth at bedtime as needed.  30 tablet   3  . AMBULATORY NON FORMULARY MEDICATION Medication Name: zostavax IM x 1  1 vial  0    History   Social History  . Marital Status: Married    Spouse Name: N/A    Number of Children: N/A  . Years of Education: N/A   Occupational History  . Not on file.   Social History Main Topics  . Smoking status: Never Smoker   .  Smokeless tobacco: Never Used  . Alcohol Use: No  . Drug Use: No  . Sexually Active: Not on file   Other Topics Concern  . Not on file   Social History Narrative  . No narrative on file    Family History  Problem Relation Age of Onset  . Lung cancer Mother   . Pulmonary fibrosis Mother   . Heart attack Father   . Diabetes Father   . Colon cancer Neg Hx   . Rectal cancer Neg Hx   . Stomach cancer Neg Hx     Review of Systems:  Vitals: Blood pressure 118/66, pulse 72, temperature 98.1 F (36.7 C), temperature source Oral, resp. rate 18, height 5' 3.19" (1.605 m), weight 238 lb (107.956 kg).  Physical Exam:  Assessment/Plan:   Jeannette Corpus, MD 12/23/2011, 1:13 PM

## 2011-12-26 ENCOUNTER — Telehealth: Payer: Self-pay | Admitting: *Deleted

## 2011-12-26 NOTE — Telephone Encounter (Signed)
Patient notified of Path results.   

## 2012-01-20 ENCOUNTER — Telehealth: Payer: Self-pay | Admitting: Oncology

## 2012-01-20 NOTE — Telephone Encounter (Signed)
Per note from LL moved 3/1 appt from her to [facility]. S/w pt today re seeing AJ instead of LL 3/1. Start time for appts reaming 11:30 am.

## 2012-01-27 ENCOUNTER — Ambulatory Visit: Payer: Medicare PPO | Admitting: Physician Assistant

## 2012-01-27 ENCOUNTER — Other Ambulatory Visit: Payer: Medicare PPO | Admitting: Lab

## 2012-02-02 ENCOUNTER — Ambulatory Visit (HOSPITAL_BASED_OUTPATIENT_CLINIC_OR_DEPARTMENT_OTHER): Payer: Medicare PPO

## 2012-02-02 ENCOUNTER — Encounter: Payer: Self-pay | Admitting: Physician Assistant

## 2012-02-02 ENCOUNTER — Telehealth: Payer: Self-pay | Admitting: Internal Medicine

## 2012-02-02 ENCOUNTER — Ambulatory Visit (HOSPITAL_BASED_OUTPATIENT_CLINIC_OR_DEPARTMENT_OTHER): Payer: Medicare PPO | Admitting: Physician Assistant

## 2012-02-02 ENCOUNTER — Other Ambulatory Visit (HOSPITAL_BASED_OUTPATIENT_CLINIC_OR_DEPARTMENT_OTHER): Payer: Medicare PPO | Admitting: Lab

## 2012-02-02 VITALS — BP 133/71 | HR 74 | Temp 98.1°F | Ht 63.0 in | Wt 239.5 lb

## 2012-02-02 DIAGNOSIS — C541 Malignant neoplasm of endometrium: Secondary | ICD-10-CM

## 2012-02-02 DIAGNOSIS — C549 Malignant neoplasm of corpus uteri, unspecified: Secondary | ICD-10-CM

## 2012-02-02 DIAGNOSIS — Z469 Encounter for fitting and adjustment of unspecified device: Secondary | ICD-10-CM

## 2012-02-02 DIAGNOSIS — C55 Malignant neoplasm of uterus, part unspecified: Secondary | ICD-10-CM

## 2012-02-02 LAB — COMPREHENSIVE METABOLIC PANEL
Alkaline Phosphatase: 67 U/L (ref 39–117)
Creatinine, Ser: 0.84 mg/dL (ref 0.50–1.10)
Glucose, Bld: 98 mg/dL (ref 70–99)
Sodium: 141 mEq/L (ref 135–145)
Total Bilirubin: 0.2 mg/dL — ABNORMAL LOW (ref 0.3–1.2)
Total Protein: 5.9 g/dL — ABNORMAL LOW (ref 6.0–8.3)

## 2012-02-02 LAB — CBC WITH DIFFERENTIAL/PLATELET
Basophils Absolute: 0 10*3/uL (ref 0.0–0.1)
EOS%: 2.4 % (ref 0.0–7.0)
Eosinophils Absolute: 0.1 10*3/uL (ref 0.0–0.5)
HCT: 28.9 % — ABNORMAL LOW (ref 34.8–46.6)
HGB: 9.8 g/dL — ABNORMAL LOW (ref 11.6–15.9)
MCH: 31.9 pg (ref 25.1–34.0)
NEUT#: 2.7 10*3/uL (ref 1.5–6.5)
NEUT%: 69.3 % (ref 38.4–76.8)
RDW: 16.6 % — ABNORMAL HIGH (ref 11.2–14.5)
lymph#: 0.9 10*3/uL (ref 0.9–3.3)

## 2012-02-02 MED ORDER — HEPARIN SOD (PORK) LOCK FLUSH 100 UNIT/ML IV SOLN
500.0000 [IU] | Freq: Once | INTRAVENOUS | Status: AC
  Administered 2012-02-02: 500 [IU] via INTRAVENOUS
  Filled 2012-02-02: qty 5

## 2012-02-02 MED ORDER — SODIUM CHLORIDE 0.9 % IJ SOLN
10.0000 mL | INTRAMUSCULAR | Status: DC | PRN
  Administered 2012-02-02: 10 mL via INTRAVENOUS
  Filled 2012-02-02: qty 10

## 2012-02-02 NOTE — Telephone Encounter (Signed)
appts made and printed for pt aom °

## 2012-02-06 NOTE — Progress Notes (Signed)
No images are attached to the encounter. No scans are attached to the encounter. No scans are attached to the encounter. [facility] OFFICE PROGRESS NOTE  METHENEY,CATHERINE, MD, MD [address]  [address]  [place] [address]  DIAGNOSIS: IIIC endometrial carcinoma diagnosed in July 2011  PRIOR THERAPY: Status post 3 cycles of neoadjuvant Taxol and carboplatin prior to surgery in August of 2011 been followed by an additional 3 cycles of Taxol and carboplatin followed by radiation  CURRENT THERAPY: Observation  INTERVAL HISTORY: [name] 66 y.o. female returns for a scheduled regular office visit for followup of her history of IIIC endometrial carcinoma. Overall the patient is doing well and reports that she has lost approximately 51 pounds since her diagnosis. She is following the findings of our nutritionist Zenovia Jarred. She continues on protonic for her gastroesophageal reflux disease. She is status post colonoscopy and endoscopy that was done in September. She was told that she had diverticular disease. She's had occasional bouts of diverticulitis she had nausea vomiting and diarrhea for about 2 days. This has since subsided. She voiced no specific complaints today.  MEDICAL HISTORY: Past Medical History  Diagnosis Date  . Back pain   . Lower extremity weakness   . Diverticulosis April 2012  . Depression   . Uterine cancer 2011    endometrialca  . Hx of radiation therapy 03/08/11-04/18/11& 5/29,6/04/03/11/12    external beam and intracavity brachytherapy  . Anxiety   . Arthritis     degenerative  . Obesity   . Anemia     multifactorial,r/t chemo and radiation also  . History of mammogram 06/14/11    b/l mammogram    ALLERGIES:   has no known allergies.  MEDICATIONS:  Current Outpatient Prescriptions  Medication Sig Dispense Refill  . AMBULATORY NON FORMULARY MEDICATION Medication Name: zostavax IM x 1  1 vial  0  . Cholecalciferol (VITAMIN D3) 1000  UNITS CAPS Take 2,000 Units by mouth daily.        Marland Kitchen FLUoxetine (PROZAC) 20 MG capsule Take 2 capsules (40 mg total) by mouth daily.  30 capsule  6  . Multiple Vitamin (MULTIVITAMIN) capsule Take 1 capsule by mouth daily.        . pantoprazole (PROTONIX) 40 MG tablet Take 1 tablet (40 mg total) by mouth daily.  30 tablet  6  . senna-docusate (SENOKOT-S) 8.6-50 MG per tablet Take 1 tablet by mouth daily as needed.        . vitamin B-12 (CYANOCOBALAMIN) 1000 MCG tablet Take 1,000 mcg by mouth daily.        Marland Kitchen zolpidem (AMBIEN) 10 MG tablet Take 1 tablet (10 mg total) by mouth at bedtime as needed.  30 tablet  3  . ZOSTAVAX [id number] UNT/0.65ML injection         SURGICAL HISTORY:  Past Surgical History  Procedure Date  . Portacath placement Jan 2012  . Abdominal hysterectomy 2011    REVIEW OF SYSTEMS:  A comprehensive review of systems was negative except for: Constitutional: positive for weight loss and The weight loss is intentional as she is following a nutritionist/dietitian's advice Gastrointestinal: positive for diarrhea, nausea and vomiting   PHYSICAL EXAMINATION: General appearance: alert, cooperative, appears stated age, no distress and morbidly obese Head: Normocephalic, without obvious abnormality, atraumatic Neck: no adenopathy, no carotid bruit, no JVD, supple, symmetrical, trachea midline and thyroid not enlarged, symmetric, no tenderness/mass/nodules Lymph nodes: Cervical, supraclavicular, and axillary nodes normal.  Resp: clear to auscultation bilaterally Cardio: regular rate and rhythm, S1, S2 normal, no murmur, click, rub or gallop GI: soft, non-tender; bowel sounds normal; no masses,  no organomegaly Extremities: extremities normal, atraumatic, no cyanosis or edema Neurologic: Alert and oriented X 3, normal strength and tone. Normal symmetric reflexes. Normal coordination and gait  ECOG PERFORMANCE STATUS: 0 - Asymptomatic  Blood pressure 133/71, pulse 74, temperature 98.1  F (36.7 C), temperature source Oral, height 5\' 3"  (1.6 m), weight 239 lb 8 oz (108.636 kg).  LABORATORY DATA: Lab Results  Component Value Date   WBC 3.9 02/02/2012   HGB 9.8* 02/02/2012   HCT 28.9* 02/02/2012   MCV 93.9 02/02/2012   PLT 151 02/02/2012      Chemistry      Component Value Date/Time   NA 141 02/02/2012 0902   K 3.8 02/02/2012 0902   CL 108 02/02/2012 0902   CO2 25 02/02/2012 0902   BUN 16 02/02/2012 0902   CREATININE 0.84 02/02/2012 0902   CREATININE 1.02 03/24/2011 1242      Component Value Date/Time   CALCIUM 8.3* 02/02/2012 0902   ALKPHOS 67 02/02/2012 0902   AST 12 02/02/2012 0902   ALT 17 02/02/2012 0902   BILITOT 0.2* 02/02/2012 0902       RADIOGRAPHIC STUDIES:  No results found.   ASSESSMENT/PLAN: The patient is a very pleasant 66 year old white female with IIIc endometrial carcinoma treated as described above. She recently had an abnormal Pap smear although the pathology revealed inflamed soft tissue and no malignancy. She's currently doing well. We'll continue to have her Port-A-Cath flushed every 8 weeks and the schedule has been laid out. She'll followup with Dr. Darrold Span in the fall, in early September with labs drawn from for her Port-A-Cath at that visit. She is to followup with GYN oncology as scheduled. Her anemia which is multifactorial is stable. She also has a history of degenerative arthritis as well as a remote history of tobacco and alcohol use. The patient was discussed with Dr. Arbutus Ped, and Dr. Precious Reel absence who is in agreement with the plan     Conni Slipper, PA-C     All questions were answered. The patient knows to call the clinic with any problems, questions or concerns. We can certainly see the patient much sooner if necessary.

## 2012-03-15 ENCOUNTER — Ambulatory Visit: Payer: Medicare PPO | Attending: Gynecologic Oncology | Admitting: Gynecologic Oncology

## 2012-03-15 ENCOUNTER — Other Ambulatory Visit (HOSPITAL_COMMUNITY)
Admission: RE | Admit: 2012-03-15 | Discharge: 2012-03-15 | Disposition: A | Discharge status: Home / Self Care | Payer: Medicare PPO | Source: Ambulatory Visit | Attending: Gynecologic Oncology | Admitting: Gynecologic Oncology

## 2012-03-15 ENCOUNTER — Encounter: Payer: Self-pay | Admitting: Gynecologic Oncology

## 2012-03-15 VITALS — BP 122/60 | HR 76 | Temp 98.0°F | Resp 20 | Ht 63.19 in | Wt 243.0 lb

## 2012-03-15 DIAGNOSIS — Z124 Encounter for screening for malignant neoplasm of cervix: Secondary | ICD-10-CM | POA: Insufficient documentation

## 2012-03-15 DIAGNOSIS — C549 Malignant neoplasm of corpus uteri, unspecified: Secondary | ICD-10-CM | POA: Insufficient documentation

## 2012-03-15 DIAGNOSIS — E669 Obesity, unspecified: Secondary | ICD-10-CM | POA: Insufficient documentation

## 2012-03-15 DIAGNOSIS — C541 Malignant neoplasm of endometrium: Secondary | ICD-10-CM

## 2012-03-15 DIAGNOSIS — Z9079 Acquired absence of other genital organ(s): Secondary | ICD-10-CM | POA: Insufficient documentation

## 2012-03-15 DIAGNOSIS — Z90710 Acquired absence of both cervix and uterus: Secondary | ICD-10-CM | POA: Insufficient documentation

## 2012-03-15 DIAGNOSIS — Z923 Personal history of irradiation: Secondary | ICD-10-CM | POA: Insufficient documentation

## 2012-03-15 DIAGNOSIS — Z79899 Other long term (current) drug therapy: Secondary | ICD-10-CM | POA: Insufficient documentation

## 2012-03-15 DIAGNOSIS — Z801 Family history of malignant neoplasm of trachea, bronchus and lung: Secondary | ICD-10-CM | POA: Insufficient documentation

## 2012-03-15 DIAGNOSIS — Z9221 Personal history of antineoplastic chemotherapy: Secondary | ICD-10-CM | POA: Insufficient documentation

## 2012-03-15 NOTE — Patient Instructions (Signed)
RTC in 6 months.

## 2012-03-15 NOTE — Progress Notes (Signed)
Consult Note: Gyn-Onc  [name] 66 y.o. female  CC:  Chief Complaint  Patient presents with  . 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  HISTORY OF PRESENT ILLNESS: This is a 66 year old who reported to Dr. Penne Carlson in December 2011 with a 24-month history of uterine bleeding, the endometrial biopsy demonstrated presence of a grade 1 endometrial  cancer. There was initial plan for hysterectomy with lymph node dissection. However, the patient noted to have tumor that rapidly progressed and involved the cervix. The PET scan demonstrated uterine cancer with significant cervical involvement. She received 3 cycles of neoadjuvant Taxol, carboplatin therapy, then underwent a robotic-  assisted laparoscopic hysterectomy, bilateral salpingo-oophorectomy, and lymph node sampling. Final pathology demonstrated invasive endometrioid adenocarcinoma involving the outer half of the myometrium and involving  the cervical stroma, angiolymphatic invasion was present and the tumor was noted to be FIGO grade II. She subsequently received additional 3 cycles of adjuvant Taxol, carboplatin therapy, completed in March 2012.  Based on the positive lymph nodes, she received external beam radiation therapy for a dose to the pelvis area 400-500 centigrade. She then underwent intracavitary brachytherapy, which was completed in June 2012.  The cumulative dose to the proximal vagina was 600-300 centigrade.   Interval History:  We last saw her in January of 2013 for biopsies. She had a Pap smear performed in radiation oncology but returned showing atypical glandular cells. The biopsy revealed inflamed soft tissue negative for malignancy. Findings at the time of her pelvic examination revealed just an area of inflammation at the apex of the vagina was felt to be most likely secondary to vaginal cuff brachii therapy. She is overall doing fairly well she does complain of a  lot of stress. Her husband had a tick bite last year and was hospitalized about 3 weeks. He had to be likely from his job. Now he is having issues with tremors and is using a walker. They have ruled out the possibility of Parkinson's. It is felt that this is most likely somehow related to a tick bite but the workup is still pending. She'll continue to lose weight and is very pleased with this. She continues working with her [name].  Review of Systems  No nausea, vomiting, fever, chills, or abdominal pain. No diarrhea, constipation, or lower extremity edema. No  hematochezia, hematocolpos, or hematuria. Otherwise 10-point review of systems negative.   Current Meds:  Outpatient Encounter Prescriptions as of 03/15/2012  Medication Sig Dispense Refill  . Cholecalciferol (VITAMIN D3) 1000 UNITS CAPS Take 2,000 Units by mouth daily.        Marland Kitchen FLUoxetine (PROZAC) 20 MG capsule Take 2 capsules (40 mg total) by mouth daily.  30 capsule  6  . Multiple Vitamin (MULTIVITAMIN) capsule Take 1 capsule by mouth daily.        . pantoprazole (PROTONIX) 40 MG tablet Take 1 tablet (40 mg total) by mouth daily.  30 tablet  6  . senna-docusate (SENOKOT-S) 8.6-50 MG per tablet Take 1 tablet by mouth daily as needed.        . vitamin B-12 (CYANOCOBALAMIN) 1000 MCG tablet Take 1,000 mcg by mouth daily.        Marland Kitchen zolpidem (AMBIEN) 10 MG tablet Take 1 tablet (10 mg total) by mouth at bedtime as needed.  30 tablet  3  . AMBULATORY NON FORMULARY MEDICATION Medication Name: zostavax IM x 1  1 vial  0  . ZOSTAVAX 33825 UNT/0.65ML injection         Allergy: No Known Allergies  Social Hx:   History   Social History  . Marital Status: Married    Spouse Name: N/A    Number of Children: N/A  . Years of Education: N/A   Occupational History  . Not on file.   Social History Main Topics  . Smoking status: Never Smoker   . Smokeless tobacco: Never Used  . Alcohol Use: No  . Drug Use: No  . Sexually Active: Not on  file   Other Topics Concern  . Not on file   Social History Narrative  . No narrative on file    Past Surgical Hx:  Past Surgical History  Procedure Date  . Portacath placement Jan 2012  . Abdominal hysterectomy 2011    Past Medical Hx:  Past Medical History  Diagnosis Date  . Back pain   . Lower extremity weakness   . Diverticulosis April 2012  . Depression   . Uterine cancer 2011    endometrialca  . Hx of radiation therapy 03/08/11-04/18/11& 5/29,6/04/03/11/12    external beam and intracavity brachytherapy  . Anxiety   . Arthritis     degenerative  . Obesity   . Anemia     multifactorial,r/t chemo and radiation also  . History of mammogram 06/14/11    b/l mammogram    Family Hx:  Family History  Problem Relation Age of Onset  . Lung cancer Mother   . Pulmonary fibrosis Mother   . Heart attack Father   . Diabetes Father   . Colon cancer Neg Hx   . Rectal cancer Neg Hx   . Stomach cancer Neg Hx     Vitals:  Blood pressure 122/60, pulse 76, temperature 98 F (36.7 C), temperature source Oral, resp. rate 20, height 5' 3.19" (1.605 m), weight 243 lb (110.224 kg).  Physical Exam:  CHEST: Clear to auscultation.  HEART: Regular rate and rhythm.  LYMPH NODE SURVEY: No cervical, supraclavicular, or inguinal adenopathy.  ABDOMEN: Soft, obese, nontender.  EXTREMITIES: No clubbing, cyanosis, or edema.  PELVIC: Normal external genitalia, Bartholin's, urethral and Skene's. Atrophic vagina. Small amount of vaginal discharge. Pap smears submitted. No masses or nodularity. RECTAL: Good tone. No cul-de-sac masses. No palpable lesions in the rectovaginal septum.   Assessment/Plan: IMPRESSION: [name] is a 66 year old without any evidence of disease  from her stage III C endometrial cancer. It is now 2 months since  completion of her radiation therapy as such a Pap test was not collected  today. I hope that [name] will be able to repeat this Pap at her  subsequent  visit with him. Signs and symptoms of recurrent disease were  discussed with the patient in addition to the importance of close follow-  Up. PLAN: We will followup on the results of your Pap smear from today. She'll follow up with [name] in 3 months and return to see [place] in 6   Suhani Stillion A., MD 03/15/2012, 2:35 PM

## 2012-03-15 NOTE — Progress Notes (Signed)
Addended by: Warner Mccreedy D on: 03/15/2012 04:05 PM   Modules accepted: Orders

## 2012-03-20 ENCOUNTER — Telehealth: Payer: Self-pay | Admitting: Gynecologic Oncology

## 2012-03-20 NOTE — Telephone Encounter (Signed)
Spoke with patient about appointment arranged with Alcario Drought, financial counselor, on Thursday March 22, 2012 at 10:30am.  No questions or concerns voiced.  Appointment arranged per patient request to discuss financial concerns.

## 2012-03-22 ENCOUNTER — Encounter: Payer: Self-pay | Admitting: Oncology

## 2012-03-22 ENCOUNTER — Telehealth: Payer: Self-pay | Admitting: Gynecologic Oncology

## 2012-03-22 NOTE — Telephone Encounter (Signed)
Pt notified about pap results: negative.  No questions or concerns voiced. 

## 2012-03-22 NOTE — Progress Notes (Signed)
Patient came in today for our meeting, patient had concerns about her financial status, I did explain to the patient that we would help if we could. I gave patient EPP application, also gave her information to Social Worker Lauren because patient was wanting to know if their where other services that she could get. Patient will be filling out application and bringing back on next week.

## 2012-03-26 ENCOUNTER — Encounter: Payer: Self-pay | Admitting: *Deleted

## 2012-03-26 NOTE — Progress Notes (Signed)
[facility] Brief Psychosocial Assessment Clinical Social Work  Clinical Social Work was referred by Terald Sleeper, financial advocate, for financial resources.  CSW met with [name] in office today.  The patient states she is very overwhelmed at this time due to her financial situation.  She cares for her eighty-year old spouse who has multiple medical problems.  She states their daily living expenses in addition to medical bills has left them in a dire situation.  [name] is already working with Ms. Flack in regards to financial assistance through [facility]. CSW validated patient's feeling of anxiety and encouraged Ms. Reinwald to work on self-care.  CSW provided patient with information on ACS, Chronic [employer], [employer], Cancer Care, and [employer].  Patient plans to follow up with CSW on Thursday. CSW also provided patient on GYN support group and encouraged her to join this group of women for support.  Kathrin Penner, MSW, [facility] Clinical Social Worker [facility] [phone]

## 2012-04-02 ENCOUNTER — Ambulatory Visit (INDEPENDENT_AMBULATORY_CARE_PROVIDER_SITE_OTHER): Payer: Medicare PPO | Admitting: Family Medicine

## 2012-04-02 ENCOUNTER — Encounter: Payer: Self-pay | Admitting: Family Medicine

## 2012-04-02 VITALS — BP 126/68 | HR 79 | Ht 65.0 in | Wt 241.0 lb

## 2012-04-02 DIAGNOSIS — M461 Sacroiliitis, not elsewhere classified: Secondary | ICD-10-CM

## 2012-04-02 DIAGNOSIS — S40019A Contusion of unspecified shoulder, initial encounter: Secondary | ICD-10-CM

## 2012-04-02 DIAGNOSIS — M47818 Spondylosis without myelopathy or radiculopathy, sacral and sacrococcygeal region: Secondary | ICD-10-CM

## 2012-04-02 DIAGNOSIS — F329 Major depressive disorder, single episode, unspecified: Secondary | ICD-10-CM

## 2012-04-02 MED ORDER — FLUOXETINE HCL 20 MG PO CAPS: 60.0000 mg | ORAL_CAPSULE | Freq: Every day | ORAL | Status: DC

## 2012-04-02 NOTE — Patient Instructions (Addendum)
Aleve twice a day fore 4-5 days.  Warm heat or compresses for 10 minutes 4 x a day as needed. Stretches for your low back.   Call if not some better in 2-3 weeks.

## 2012-04-02 NOTE — Progress Notes (Signed)
  Subjective:    Patient ID: [name], female    DOB: 1946-06-23, 66 y.o.   MRN: [id number]  HPI Left post shoulder pain - Noticed it yesterday when toweling off in the shoulder. Noticed tender spot. Tender to touch. No limitation on ROM of the shoulder. Denies any trauma. Had recent CBC with Dr. Darrold Span that was normal.   Left low back pain - Started yesterday afternoon after lifting her husband out of bed. She is his primar care tacker. Worse when changes position. Can walk without difficulty. No radiation of pain.  Took some pain reliever OTC but didn't really help yesterday. No numnbess or tingling.  No real alleviating sxs.    Depression - She says has been really stressed lately with caring for her husband. Says some days will take 3 instead of 2.  Says she feels like she will scream. Says has been taking her ambien most nights, before used to use it rarely.husband family is not supportive and she has not other help. Her husband is also not the most freiendly and supportive spouse either  Uterine cancer - her recent bx was neg (about 2 weeks ago). She is very happy. Does have some radiation changes though.   Review of Systems     Objective:   Physical Exam  Constitutional: She is oriented to person, place, and time. She appears well-developed and well-nourished.       Obese  Musculoskeletal:       The shoulder with normal range of motion. She does have a contusion to the posterior shoulder. She is tender directly over the area. No a.c. joint tenderness. She's not tender at the deltoid. Strength is normal in both upper extremities. Strength is normal in the hips, knees, ankles bilaterally.She if very tender over the left SI joint.  UE and LE reflexes 2+ bilaterally.    Neurological: She is alert and oriented to person, place, and time.  Skin: Skin is warm and dry.  Psychiatric: She has a normal mood and affect. Her behavior is normal.          Assessment & Plan:  Contusion left  post shoulder - reassured her it is a bruise.  Should likely heal on its own.  Call if not better in one week.    SI joint pain- she is very tender directly over the SI joint. We discussed heat to help relax the area. She says she does have a heating pad on. Also start Aleve twice a day for the next 4-5 days. She is to stop immediately she has any GI upset or irritation. I also gave her handouts and gentle stretches to do. If she's not getting some improvement over the next 2-3 weeks and please call the office. Otherwise we'll see her back a month for her mood and we can see how her SI joint is doing at that time.Can call the office if feels her pain is not being well controlled. We need to work on getting help for her husband so she is not having to lift him.   Depression - inc fluoxetine to 60mg  and f/u in 1 months. Encouraged her to call her husband's doc to see if they can help him with transfers.  Ambien prn for sleep. She would be a great candidate for counseling but unfortunately financially this would be very difficult for her.

## 2012-04-05 ENCOUNTER — Ambulatory Visit (HOSPITAL_BASED_OUTPATIENT_CLINIC_OR_DEPARTMENT_OTHER): Payer: Medicare PPO

## 2012-04-05 VITALS — BP 127/70 | HR 75 | Temp 97.2°F

## 2012-04-05 DIAGNOSIS — C55 Malignant neoplasm of uterus, part unspecified: Secondary | ICD-10-CM

## 2012-04-05 DIAGNOSIS — Z452 Encounter for adjustment and management of vascular access device: Secondary | ICD-10-CM

## 2012-04-05 MED ORDER — HEPARIN SOD (PORK) LOCK FLUSH 100 UNIT/ML IV SOLN
500.0000 [IU] | Freq: Once | INTRAVENOUS | Status: AC
  Administered 2012-04-05: 500 [IU] via INTRAVENOUS
  Filled 2012-04-05: qty 5

## 2012-04-05 MED ORDER — SODIUM CHLORIDE 0.9 % IJ SOLN
10.0000 mL | INTRAMUSCULAR | Status: DC | PRN
  Administered 2012-04-05: 10 mL via INTRAVENOUS
  Filled 2012-04-05: qty 10

## 2012-04-05 NOTE — Patient Instructions (Signed)
Call MD with any problems 

## 2012-04-30 ENCOUNTER — Ambulatory Visit: Admission: RE | Admit: 2012-04-30 | Payer: Medicare PPO | Source: Ambulatory Visit | Admitting: Radiation Oncology

## 2012-05-01 ENCOUNTER — Ambulatory Visit (INDEPENDENT_AMBULATORY_CARE_PROVIDER_SITE_OTHER): Payer: Medicare PPO | Admitting: Family Medicine

## 2012-05-01 ENCOUNTER — Encounter: Payer: Self-pay | Admitting: Family Medicine

## 2012-05-01 ENCOUNTER — Other Ambulatory Visit: Payer: Self-pay | Admitting: Family Medicine

## 2012-05-01 VITALS — BP 103/68 | HR 75 | Ht 65.0 in | Wt 237.0 lb

## 2012-05-01 DIAGNOSIS — C549 Malignant neoplasm of corpus uteri, unspecified: Secondary | ICD-10-CM

## 2012-05-01 DIAGNOSIS — R197 Diarrhea, unspecified: Secondary | ICD-10-CM

## 2012-05-01 DIAGNOSIS — R52 Pain, unspecified: Secondary | ICD-10-CM

## 2012-05-01 DIAGNOSIS — F438 Other reactions to severe stress: Secondary | ICD-10-CM

## 2012-05-01 DIAGNOSIS — F329 Major depressive disorder, single episode, unspecified: Secondary | ICD-10-CM

## 2012-05-01 DIAGNOSIS — R1084 Generalized abdominal pain: Secondary | ICD-10-CM

## 2012-05-01 DIAGNOSIS — C541 Malignant neoplasm of endometrium: Secondary | ICD-10-CM

## 2012-05-01 DIAGNOSIS — F430 Acute stress reaction: Secondary | ICD-10-CM

## 2012-05-01 LAB — COMPLETE METABOLIC PANEL WITH GFR
Albumin: 4.1 g/dL (ref 3.5–5.2)
Alkaline Phosphatase: 81 U/L (ref 39–117)
BUN: 18 mg/dL (ref 6–23)
CO2: 25 mEq/L (ref 19–32)
Calcium: 8.7 mg/dL (ref 8.4–10.5)
GFR, Est African American: 63 mL/min
GFR, Est Non African American: 54 mL/min — ABNORMAL LOW
Glucose, Bld: 97 mg/dL (ref 70–99)
Potassium: 4.5 mEq/L (ref 3.5–5.3)

## 2012-05-01 LAB — CBC W/MCH & 3 PART DIFF
Lymphs Abs: 0.9 10*3/uL (ref 0.7–4.0)
MCH: 30.4 pg (ref 26.0–34.0)
MCHC: 32.2 g/dL (ref 30.0–36.0)
MCV: 94.4 fL (ref 78.0–100.0)
Neutrophils Relative %: 71 % (ref 43–77)
Platelets: 188 10*3/uL (ref 150–400)
RBC: 3.72 MIL/uL — ABNORMAL LOW (ref 3.87–5.11)

## 2012-05-01 LAB — AMYLASE: Amylase: 23 U/L (ref 0–105)

## 2012-05-01 MED ORDER — CIPROFLOXACIN HCL 500 MG PO TABS: 500.0000 mg | ORAL_TABLET | Freq: Two times a day (BID) | ORAL | Status: AC

## 2012-05-01 MED ORDER — ALPRAZOLAM 0.25 MG PO TABS: 0.2500 mg | ORAL_TABLET | Freq: Every day | ORAL | Status: DC | PRN

## 2012-05-01 NOTE — Progress Notes (Signed)
  Subjective:    Patient ID: [name], female    DOB: 1946/08/01, 66 y.o.   MRN: [id number]  HPI 2 days of watery diarrhea and lower abdominal pain right and left.  No fever.  No blood in stool.  Did vomit once 2 days ago.   Dec appetite.  Says it feel like her diverticulitis. No travel.    Just found out husband has a brain tumor. It can cuase memory loss and tremor and personality change.  Inoperable.  He can no onger walk  her fluoxetine to TID. Finds herself wanting to sleep all the time.  She doesn't have the best relationship with her husbands kids.  Says going to church is her escape.     Review of Systems     Objective:   Physical Exam  Constitutional: She is oriented to person, place, and time. She appears well-developed and well-nourished.  HENT:  Head: Normocephalic and atraumatic.  Cardiovascular: Normal rate, regular rhythm and normal heart sounds.   Pulmonary/Chest: Effort normal and breath sounds normal.  Abdominal: Soft. Bowel sounds are normal. She exhibits no distension and no mass. There is tenderness. There is no rebound and no guarding.       Some rebound in the LUQ, diffusely tender.   Neurological: She is alert and oriented to person, place, and time.  Skin: Skin is warm and dry.  Psychiatric: She has a normal mood and affect. Her behavior is normal.          Assessment & Plan:  Diarrhea/Abdominal Pain - possible diverticulitis. Check CBC w/ diff stat.  If elevated will tx as diverticulitis. Clear liquid diet for now. If unable to keep po down or high fever or not getting better then will need to go to the ED.    Social stressors - husband with brain tumor. counseling not an option at this time.    Depression - Taking 60mg  total daily of fluoxetine.  Discussed option of counseling but she says she can't leave the home bc can't leave him by herself.  Discussed prn xanx. Patient ok with this.  Call if feeling very depressed. PHQ-9 score of 17.

## 2012-05-07 ENCOUNTER — Other Ambulatory Visit: Payer: Self-pay | Admitting: Oncology

## 2012-05-07 DIAGNOSIS — Z1231 Encounter for screening mammogram for malignant neoplasm of breast: Secondary | ICD-10-CM

## 2012-05-08 ENCOUNTER — Ambulatory Visit (INDEPENDENT_AMBULATORY_CARE_PROVIDER_SITE_OTHER): Payer: Medicare PPO | Admitting: Family Medicine

## 2012-05-08 ENCOUNTER — Telehealth: Payer: Self-pay | Admitting: *Deleted

## 2012-05-08 ENCOUNTER — Encounter: Payer: Self-pay | Admitting: Family Medicine

## 2012-05-08 ENCOUNTER — Other Ambulatory Visit: Payer: Self-pay | Admitting: Family Medicine

## 2012-05-08 VITALS — BP 112/63 | HR 69 | Ht 65.0 in | Wt 243.0 lb

## 2012-05-08 DIAGNOSIS — F329 Major depressive disorder, single episode, unspecified: Secondary | ICD-10-CM

## 2012-05-08 DIAGNOSIS — R109 Unspecified abdominal pain: Secondary | ICD-10-CM

## 2012-05-08 DIAGNOSIS — L299 Pruritus, unspecified: Secondary | ICD-10-CM

## 2012-05-08 DIAGNOSIS — F438 Other reactions to severe stress: Secondary | ICD-10-CM

## 2012-05-08 DIAGNOSIS — F430 Acute stress reaction: Secondary | ICD-10-CM

## 2012-05-08 NOTE — Patient Instructions (Signed)
We will call you with your lab results. If you don't here from [place] in about a week then please give [place] a call at [phone]. If you develop a rash then let me know.

## 2012-05-08 NOTE — Progress Notes (Signed)
  Subjective:    Patient ID: [name], female    DOB: 1946-09-18, 66 y.o.   MRN: [id number]  HPI Says over the weekend feels like something in on her itching her all over.  Says has been rubbing her self in alcohol to try to keep it cool and helps. No new detergents, soaps, etc.  No new meds.  Says feeling overwhelmed.   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 Unfortunately is not amenable to surgery and radiation will only be palliative. She did start the Xanax I gave her. She says it was helpful. She took a whole tablet. She's taken this in the past it before and did well with it. She's not had any side effects with the medication. She does not feel that her itching is related to her medications as nothing has changed recently. She did take Cipro for her bowels but she is taking his multiple times before without any difficulty.   Review of Systems     Objective:   Physical Exam  Constitutional: She is oriented to person, place, and time. She appears well-developed and well-nourished.  HENT:  Head: Normocephalic and atraumatic.  Cardiovascular: Normal rate, regular rhythm and normal heart sounds.   Pulmonary/Chest: Effort normal and breath sounds normal.  Abdominal: Soft. Bowel sounds are normal. She exhibits no distension and no mass. There is no tenderness. There is no rebound and no guarding.  Neurological: She is alert and oriented to person, place, and time.  Skin: Skin is warm and dry.  Psychiatric: She has a normal mood and affect. Her behavior is normal.          Assessment & Plan:  Pruritis - Will check liver enzymes. Can try benadrul thought i am not convinced this is allergic.  She denies any soaps, lotions, perfumes, fabric softeners etc. I agree with her that I think this may be somewhat related to her situational anxiety and depression. She can increase the Xanax to once a day if needed. Warned again about dependency  issues with the medication and try not to use it more than once a day.  Acute situation stress and depression - Recommended again couseling.  I tink this would help her more than anything.  She is thinking about it.  PHQ- 9 score of 17.  She is still struggling. I hesitate to increase her fluoxetine to 60 mg. She says she will agree to try going to counseling. To me to get some coverage to watch her husband while she is away which may be a little difficult.

## 2012-05-09 ENCOUNTER — Other Ambulatory Visit: Payer: Self-pay

## 2012-05-09 ENCOUNTER — Ambulatory Visit
Admission: RE | Admit: 2012-05-09 | Discharge: 2012-05-09 | Disposition: A | Discharge status: Home / Self Care | Payer: Medicare PPO | Source: Ambulatory Visit | Attending: Radiation Oncology | Admitting: Radiation Oncology

## 2012-05-09 ENCOUNTER — Encounter: Payer: Self-pay | Admitting: Radiation Oncology

## 2012-05-09 VITALS — BP 121/77 | HR 73 | Temp 97.9°F | Resp 18 | Wt 243.7 lb

## 2012-05-09 DIAGNOSIS — Z01419 Encounter for gynecological examination (general) (routine) without abnormal findings: Secondary | ICD-10-CM | POA: Insufficient documentation

## 2012-05-09 DIAGNOSIS — C541 Malignant neoplasm of endometrium: Secondary | ICD-10-CM

## 2012-05-09 LAB — COMPLETE METABOLIC PANEL WITH GFR
Albumin: 3.8 g/dL (ref 3.5–5.2)
BUN: 20 mg/dL (ref 6–23)
CO2: 26 mEq/L (ref 19–32)
Calcium: 8.5 mg/dL (ref 8.4–10.5)
Chloride: 104 mEq/L (ref 96–112)
GFR, Est African American: 59 mL/min — ABNORMAL LOW
GFR, Est Non African American: 51 mL/min — ABNORMAL LOW
Glucose, Bld: 84 mg/dL (ref 70–99)
Potassium: 4.1 mEq/L (ref 3.5–5.3)

## 2012-05-09 LAB — FOLATE: Folate: 14.1 ng/mL

## 2012-05-09 LAB — CBC WITH DIFFERENTIAL/PLATELET
HCT: 32.8 % — ABNORMAL LOW (ref 36.0–46.0)
Hemoglobin: 10.4 g/dL — ABNORMAL LOW (ref 12.0–15.0)
Lymphocytes Relative: 27 % (ref 12–46)
MCV: 95.6 fL (ref 78.0–100.0)
Monocytes Absolute: 0.3 10*3/uL (ref 0.1–1.0)
Monocytes Relative: 6 % (ref 3–12)
Neutro Abs: 3.2 10*3/uL (ref 1.7–7.7)
WBC: 4.9 10*3/uL (ref 4.0–10.5)

## 2012-05-09 LAB — TSH: TSH: 1.99 u[IU]/mL (ref 0.350–4.500)

## 2012-05-09 LAB — VITAMIN B12: Vitamin B-12: 1090 pg/mL — ABNORMAL HIGH (ref 211–911)

## 2012-05-09 NOTE — Progress Notes (Signed)
Radiation Oncology         [phone] ________________________________  Name: [name] MRN: [id number]  Date: 05/09/2012  DOB: 01-Aug-1946  Follow-Up Visit Note  CC: METHENEY,CATHERINE, MD  Reece Packer, MD  Diagnosis:   Endometrioid adenocarcinoma  Interval Since Last Radiation:  12 months  Narrative:  The patient returns today for routine follow-up.  The patient did see Dr. Duard Brady in April was unremarkable pelvic exam and biopsy. She denies any vaginal bleeding. Patient is under a lot of stress at this time as her husband was recently diagnosed with unresectable brain tumor. He is only a candidate for palliative treatment.   She recently developed some left lower quadrant pain. She was seen by her primary care physician Dr. Joneen Caraway. She was placed on Cipro and has had no further problems with this issue.                        ALLERGIES:   has no known allergies.  Meds: Current Outpatient Prescriptions  Medication Sig Dispense Refill  . ALPRAZolam (XANAX) 0.25 MG tablet Take 1 tablet (0.25 mg total) by mouth daily as needed for sleep.  30 tablet  0  . Cholecalciferol (VITAMIN D3) 1000 UNITS CAPS Take 2,000 Units by mouth daily.        . ciprofloxacin (CIPRO) 500 MG tablet Take 1 tablet (500 mg total) by mouth 2 (two) times daily.  14 tablet  0  . FLUoxetine (PROZAC) 20 MG capsule Take 3 capsules (60 mg total) by mouth daily.  90 capsule  6  . Multiple Vitamin (MULTIVITAMIN) capsule Take 1 capsule by mouth daily.        . pantoprazole (PROTONIX) 40 MG tablet Take 1 tablet (40 mg total) by mouth daily.  30 tablet  6  . senna-docusate (SENOKOT-S) 8.6-50 MG per tablet Take 1 tablet by mouth daily as needed.        . vitamin B-12 (CYANOCOBALAMIN) 1000 MCG tablet Take 1,000 mcg by mouth daily.        Marland Kitchen zolpidem (AMBIEN) 10 MG tablet Take 1 tablet (10 mg total) by mouth at bedtime as needed.  30 tablet  3    Physical Findings: The patient is in no acute distress. Patient is  alert and oriented.  weight is 243 lb 11.2 oz (110.542 kg). Her oral temperature is 97.9 F (36.6 C). Her blood pressure is 121/77 and her pulse is 73. Her respiration is 18. Marland Kitchen  No palpable cervical or subclavicular adenopathy. The lungs are clear to auscultation. The heart has a regular rhythm and rate. The abdomen is soft and nontender with normal bowel sounds. The inguinal areas are free of adenopathy. Patient does have significant yeast infection in the skin folds in the lower abdominal area. Patient was given antifungal powder for this issue. Pelvic examination the external genitalia are unremarkable. A speculum exam is performed. There are some radiation changes noted in the proximal vagina. A Pap smear was obtained obtained of this area. On bimanual and rectovaginal examination there are no pelvic masses appreciated.  Lab Findings: Lab Results  Component Value Date   WBC 4.9 05/08/2012   HGB 10.4* 05/08/2012   HCT 32.8* 05/08/2012   MCV 95.6 05/08/2012   PLT 193 05/08/2012    @LASTCHEM @  Radiographic Findings: No results found.  Impression:  Clinically no evidence of recurrence, Pap smear pending  Plan:  Patient returns for routine followup in 6 months and  in the interim will be seen in  GYN oncology.   Billie Lade, PhD, MD

## 2012-05-09 NOTE — Progress Notes (Signed)
HERE TODAY FOR FU OF ENDOMETRIAL CA.  NO REPORTS OF BLEEDING, PAIN OR DISCOMFORT.  HAD FU WITH DR. CLARK-PEARSON DID BIOPSY IN January 2013, DR. [facility] DID PAP ON March 15, 2012.  LOOKS GOOD TODAY AND SAYS SHE FEELS GOOD.  AFFECT IS GOOD.  SCHEDULED FOR MAMMOGRAM ON June 25TH.

## 2012-05-09 NOTE — Addendum Note (Signed)
Encounter addended by: Billie Lade, MD on: 05/09/2012 12:47 PM<BR>     Documentation filed: Visit Diagnoses

## 2012-05-09 NOTE — Progress Notes (Signed)
Quick Note:  All labs are normal. ______ 

## 2012-05-18 ENCOUNTER — Ambulatory Visit: Payer: Medicare PPO | Admitting: Family Medicine

## 2012-05-22 ENCOUNTER — Other Ambulatory Visit: Payer: Self-pay | Admitting: Oncology

## 2012-05-22 ENCOUNTER — Ambulatory Visit
Admission: RE | Admit: 2012-05-22 | Discharge: 2012-05-22 | Disposition: A | Discharge status: Home / Self Care | Payer: Medicare PPO | Source: Ambulatory Visit | Attending: Oncology | Admitting: Oncology

## 2012-05-22 DIAGNOSIS — Z1231 Encounter for screening mammogram for malignant neoplasm of breast: Secondary | ICD-10-CM

## 2012-05-30 ENCOUNTER — Ambulatory Visit (HOSPITAL_COMMUNITY): Payer: Medicare PPO | Admitting: Licensed Clinical Social Worker

## 2012-06-05 ENCOUNTER — Ambulatory Visit (HOSPITAL_COMMUNITY): Payer: Medicare PPO | Admitting: Licensed Clinical Social Worker

## 2012-06-06 ENCOUNTER — Other Ambulatory Visit (HOSPITAL_COMMUNITY)
Admission: RE | Admit: 2012-06-06 | Discharge: 2012-06-06 | Disposition: A | Discharge status: Home / Self Care | Payer: Medicare PPO | Source: Ambulatory Visit | Attending: Radiation Oncology | Admitting: Radiation Oncology

## 2012-06-07 ENCOUNTER — Ambulatory Visit (HOSPITAL_BASED_OUTPATIENT_CLINIC_OR_DEPARTMENT_OTHER): Payer: Medicare PPO

## 2012-06-07 VITALS — BP 145/84 | HR 90 | Temp 98.0°F

## 2012-06-07 DIAGNOSIS — C549 Malignant neoplasm of corpus uteri, unspecified: Secondary | ICD-10-CM

## 2012-06-07 DIAGNOSIS — Z452 Encounter for adjustment and management of vascular access device: Secondary | ICD-10-CM

## 2012-06-07 DIAGNOSIS — C541 Malignant neoplasm of endometrium: Secondary | ICD-10-CM

## 2012-06-07 MED ORDER — HEPARIN SOD (PORK) LOCK FLUSH 100 UNIT/ML IV SOLN
500.0000 [IU] | Freq: Once | INTRAVENOUS | Status: AC
  Administered 2012-06-07: 500 [IU] via INTRAVENOUS
  Filled 2012-06-07: qty 5

## 2012-06-07 MED ORDER — SODIUM CHLORIDE 0.9 % IJ SOLN
10.0000 mL | INTRAMUSCULAR | Status: DC | PRN
  Administered 2012-06-07: 10 mL via INTRAVENOUS
  Filled 2012-06-07: qty 10

## 2012-06-11 ENCOUNTER — Ambulatory Visit: Payer: Medicare PPO | Admitting: Family Medicine

## 2012-06-15 ENCOUNTER — Encounter: Payer: Self-pay | Admitting: Family Medicine

## 2012-06-15 ENCOUNTER — Ambulatory Visit (INDEPENDENT_AMBULATORY_CARE_PROVIDER_SITE_OTHER): Payer: Medicare PPO | Admitting: Family Medicine

## 2012-06-15 VITALS — BP 112/64 | HR 69 | Ht 65.0 in | Wt 247.0 lb

## 2012-06-15 DIAGNOSIS — F438 Other reactions to severe stress: Secondary | ICD-10-CM

## 2012-06-15 DIAGNOSIS — R1084 Generalized abdominal pain: Secondary | ICD-10-CM

## 2012-06-15 DIAGNOSIS — F329 Major depressive disorder, single episode, unspecified: Secondary | ICD-10-CM

## 2012-06-15 DIAGNOSIS — R197 Diarrhea, unspecified: Secondary | ICD-10-CM

## 2012-06-15 DIAGNOSIS — C541 Malignant neoplasm of endometrium: Secondary | ICD-10-CM

## 2012-06-15 DIAGNOSIS — Z23 Encounter for immunization: Secondary | ICD-10-CM

## 2012-06-15 DIAGNOSIS — F430 Acute stress reaction: Secondary | ICD-10-CM

## 2012-06-15 MED ORDER — PANTOPRAZOLE SODIUM 40 MG PO TBEC: 40.0000 mg | DELAYED_RELEASE_TABLET | Freq: Every day | ORAL | Status: DC

## 2012-06-15 MED ORDER — ALPRAZOLAM 0.25 MG PO TABS: 0.2500 mg | ORAL_TABLET | Freq: Every day | ORAL | Status: DC | PRN

## 2012-06-15 NOTE — Progress Notes (Signed)
  Subjective:    Patient ID: [name], female    DOB: October 30, 1946, 66 y.o.   MRN: [id number]  HPI Says 3 days after increased her anxiety medicine her itching got much better.  Taking 60mg  of prozac. She denies the side effects on increased dose. Not using her xanax prn, not daily.  Has been talking to her pastor ansd that has helped.  Still helping at th [employer].  No SE on the inc dose of medication. Her husband has a brain tumor is not doing well. Unfortunately it is inoperable.  She has a history of endometrial carcinoma. She was treated in December 2011. She had a followup Pap smear June 12. She says she's call their office to get results but has not heard back from them. She's very worried about the potential results.  Review of Systems     Objective:   Physical Exam  Constitutional: She is oriented to person, place, and time. She appears well-developed and well-nourished.  HENT:  Head: Normocephalic and atraumatic.  Cardiovascular: Normal rate, regular rhythm and normal heart sounds.   Pulmonary/Chest: Effort normal and breath sounds normal.  Neurological: She is alert and oriented to person, place, and time.  Skin: Skin is warm and dry.  Psychiatric: She has a normal mood and affect. Her behavior is normal.          Assessment & Plan:  Acute situational depression -Discussed continue current regimen. I think this is working well with no SE. Still struggling with her husband having an inoperable brain tumor.  He is not doing well.  She is seeing her pastor for counseling.  I offered to get her in with a licensed counselor if she would like. She will think about it. Otherwise continue Prozac 60 mg total and using her alprazolam as needed. F/U in 2months .    Given TDap today.  Shingles vaccine is up-to-date.  Endometrial cancer-we looked for the Pap smear results in the computer system and unfortunately they were not resulted. We call [facility] pathology and they faxed  over the report. It was negative for any intraepithelial lesions or changes. Otherwise essentially normal. I gave her a copy of the report. She was ecstatic to hear the news.

## 2012-06-18 ENCOUNTER — Other Ambulatory Visit: Payer: Self-pay | Admitting: Family Medicine

## 2012-06-18 MED ORDER — ZOLPIDEM TARTRATE 10 MG PO TABS: 5.0000 mg | ORAL_TABLET | Freq: Every evening | ORAL | Status: DC | PRN

## 2012-06-18 MED ORDER — ZOLPIDEM TARTRATE 5 MG PO TABS: 5.0000 mg | ORAL_TABLET | Freq: Every evening | ORAL | Status: DC | PRN

## 2012-06-19 ENCOUNTER — Ambulatory Visit: Payer: Medicare PPO

## 2012-06-26 ENCOUNTER — Telehealth: Payer: Self-pay | Admitting: Oncology

## 2012-06-26 NOTE — Telephone Encounter (Signed)
Talked to pt , r/s appt fr 9/9/to 9/5 per pt rqst

## 2012-07-03 ENCOUNTER — Ambulatory Visit (INDEPENDENT_AMBULATORY_CARE_PROVIDER_SITE_OTHER): Payer: Medicare PPO

## 2012-07-03 ENCOUNTER — Telehealth: Payer: Self-pay | Admitting: Family Medicine

## 2012-07-03 DIAGNOSIS — Z1231 Encounter for screening mammogram for malignant neoplasm of breast: Secondary | ICD-10-CM

## 2012-07-03 NOTE — Telephone Encounter (Signed)
Cal pt: Options since her insruanc won't cover Ambien any more for this year.   1.  Can cut the 5mg  tabs in half.    2.  She can call around and price shop for cash price of ambien. Can try [facility] ( doesn't have to have a membership to use the pharmacy).  In which case if can afford it can pay cash.   3.  We can try a different sleep aid like lunesta but they may limit it too but we could try it.

## 2012-07-04 NOTE — Telephone Encounter (Signed)
Left message to call back  

## 2012-07-04 NOTE — Telephone Encounter (Signed)
Pt states that she wants to try the Lunesta. She would like to be called on her cell phone.

## 2012-07-05 MED ORDER — ESZOPICLONE 2 MG PO TABS: 2.0000 mg | ORAL_TABLET | Freq: Every evening | ORAL | Status: DC | PRN

## 2012-07-05 NOTE — Telephone Encounter (Signed)
Pt informed

## 2012-07-09 ENCOUNTER — Telehealth: Payer: Self-pay | Admitting: *Deleted

## 2012-07-09 MED ORDER — TRAZODONE HCL 50 MG PO TABS: 50.0000 mg | ORAL_TABLET | Freq: Every day | ORAL | Status: DC

## 2012-07-09 NOTE — Telephone Encounter (Signed)
Pt would like to try trazodone for her her sleep and then if that doesn't work she will try the ambien 10 mg tablet  And cut them in half

## 2012-07-09 NOTE — Telephone Encounter (Signed)
Prescription sent

## 2012-07-10 NOTE — Progress Notes (Signed)
Called patient with PAP results 

## 2012-07-16 ENCOUNTER — Telehealth: Payer: Self-pay | Admitting: *Deleted

## 2012-07-16 NOTE — Telephone Encounter (Addendum)
The one ordered by Dr. Darrold Span in June of 2012 was normal but I agree, I don't see the report for this year. We will need to actually call the imaging place to get the final report. Not sure why didn't crossover into the computer system.

## 2012-07-16 NOTE — Telephone Encounter (Signed)
Pt calling for results of Mammogram. I did not see them. Pease advise.

## 2012-07-18 NOTE — Telephone Encounter (Signed)
Results faxed. Normal study. Mailed Pt copy

## 2012-07-27 ENCOUNTER — Ambulatory Visit (INDEPENDENT_AMBULATORY_CARE_PROVIDER_SITE_OTHER): Payer: Medicare PPO | Admitting: Sports Medicine

## 2012-07-27 DIAGNOSIS — B86 Scabies: Secondary | ICD-10-CM | POA: Insufficient documentation

## 2012-07-27 MED ORDER — HYDROXYZINE HCL 25 MG PO TABS: 25.0000 mg | ORAL_TABLET | Freq: Three times a day (TID) | ORAL | Status: AC | PRN

## 2012-07-27 MED ORDER — PERMETHRIN 5 % EX CREA: TOPICAL_CREAM | Freq: Once | CUTANEOUS | Status: AC

## 2012-07-27 NOTE — Assessment & Plan Note (Signed)
The lesions do not follow any dermatomal distribution. With her history, occupation, as well as clinical presentation I do suspect scabies. Permethrin topical applied from neck to feet, and left in overnight. She may use hydroxyzine as needed for itch. She may reapply the permethrin in 2 weeks if the itch continues times one. I will see her back on an as-needed basis.

## 2012-07-27 NOTE — Progress Notes (Signed)
Patient ID: LANEICE MENEELY, female   DOB: 01-18-1946, 66 y.o.   MRN: [id number] Subjective:    CC: Rash on wrists  HPI: Parris is a very pleasant 66 year old female comes in with a one day history of rash that she localizes her left wrist, fingers, right wrist, right arm, ankles. This is intensely pruritic. She wonders if this might be shingles. The rash did not start as a vesicular lesion, however really started out as punctate lesions. 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 The rash is localized in the above areas, she has not tried any treatment, no one else has had similar symptoms.  Past medical history, Surgical history, Family history, Social history, Allergies, and medications have been entered into the medical record, reviewed, and no changes needed.   Review of Systems: No fevers, chills, night sweats, weight loss, chest pain, or shortness of breath.   Objective:    General: Well Developed, well nourished, and in no acute distress.  Neuro: Alert and oriented x3, extra-ocular muscles intact.  HEENT: Normocephalic, atraumatic, pupils equal round reactive to light, neck supple, no masses, no lymphadenopathy, thyroid nonpalpable.  Skin: There are multiple excoriated, punctate lesions with burrows noted between her fingers, on her wrist, both arms, and lower legs. There is no sign of bacterial superinfection. Cardiac: Regular rate and rhythm, no murmurs rubs or gallops.  Respiratory: Clear to auscultation bilaterally. Not using accessory muscles, speaking in full sentences.  Impression and Recommendations:

## 2012-07-27 NOTE — Patient Instructions (Addendum)
Scabies Scabies are small bugs (mites) that burrow under the skin and cause red bumps and severe itching. These bugs can only be seen with a microscope. Scabies are highly contagious. They can spread easily from person to person by direct contact. They are also spread through sharing clothing or linens that have the scabies mites living in them. It is not unusual for an entire family to become infected through shared towels, clothing, or bedding.  HOME CARE INSTRUCTIONS   Your caregiver may prescribe a cream or lotion to kill the mites. If this cream is prescribed; massage the cream into the entire area of the body from the neck to the bottom of both feet. Also massage the cream into the scalp and face if your child is less than 1 year old. Avoid the eyes and mouth.   Leave the cream on for 8 to12 hours. Do not wash your hands after application. Your child should bathe or shower after the 8 to 12 hour application period. Sometimes it is helpful to apply the cream to your child at right before bedtime.   One treatment is usually effective and will eliminate approximately 95% of infestations. For severe cases, your caregiver may decide to repeat the treatment in 1 week. Everyone in your household should be treated with one application of the cream.   New rashes or burrows should not appear after successful treatment within 24 to 48 hours; however the itching and rash may last for 2 to 4 weeks after successful treatment. If your symptoms persist longer than this, see your caregiver.   Your caregiver also may prescribe a medication to help with the itching or to help the rash go away more quickly.   Scabies can live on clothing or linens for up to 3 days. Your entire child's recently used clothing, towels, stuffed toys, and bed linens should be washed in hot water and then dried in a dryer for at least 20 minutes on high heat. Items that cannot be washed should be enclosed in a plastic bag for at least 3  days.   To help relieve itching, bathe your child in a cool bath or apply cool washcloths to the affected areas.   Your child may return to school after treatment with the prescribed cream.  SEEK MEDICAL CARE IF:   The itching persists longer than 4 weeks after treatment.   The rash spreads or becomes infected (the area has red blisters or yellow-tan crust).  Document Released: 11/14/2005 Document Revised: 11/03/2011 Document Reviewed: 03/25/2009 Exit[employer] Patient Information 2012 [employer]. 

## 2012-08-01 ENCOUNTER — Telehealth: Payer: Self-pay | Admitting: Family Medicine

## 2012-08-01 DIAGNOSIS — N951 Menopausal and female climacteric states: Secondary | ICD-10-CM

## 2012-08-01 NOTE — Telephone Encounter (Signed)
She is due for her DEXA scan. If she think she's had over the last 2 years and please let me know but I think it has been more than that then we need to get this scheduled for her. Or she may not have had one before. I cannot find one on file. I do also recommend a yearly eye exam.

## 2012-08-02 ENCOUNTER — Encounter: Payer: Self-pay | Admitting: Physician Assistant

## 2012-08-02 ENCOUNTER — Ambulatory Visit (HOSPITAL_BASED_OUTPATIENT_CLINIC_OR_DEPARTMENT_OTHER): Payer: Medicare PPO

## 2012-08-02 ENCOUNTER — Other Ambulatory Visit: Payer: Medicare PPO | Admitting: Lab

## 2012-08-02 ENCOUNTER — Ambulatory Visit (HOSPITAL_BASED_OUTPATIENT_CLINIC_OR_DEPARTMENT_OTHER): Payer: Medicare PPO | Admitting: Physician Assistant

## 2012-08-02 VITALS — BP 121/69 | HR 71 | Temp 96.9°F | Resp 18 | Ht 65.5 in | Wt 250.8 lb

## 2012-08-02 DIAGNOSIS — C549 Malignant neoplasm of corpus uteri, unspecified: Secondary | ICD-10-CM

## 2012-08-02 DIAGNOSIS — C55 Malignant neoplasm of uterus, part unspecified: Secondary | ICD-10-CM

## 2012-08-02 DIAGNOSIS — C541 Malignant neoplasm of endometrium: Secondary | ICD-10-CM

## 2012-08-02 LAB — CBC WITH DIFFERENTIAL/PLATELET
Basophils Absolute: 0 10*3/uL (ref 0.0–0.1)
Eosinophils Absolute: 0.3 10*3/uL (ref 0.0–0.5)
HGB: 10.1 g/dL — ABNORMAL LOW (ref 11.6–15.9)
LYMPH%: 21.9 % (ref 14.0–49.7)
MONO#: 0.4 10*3/uL (ref 0.1–0.9)
NEUT#: 3.6 10*3/uL (ref 1.5–6.5)
Platelets: 157 10*3/uL (ref 145–400)
RBC: 3.25 10*6/uL — ABNORMAL LOW (ref 3.70–5.45)
RDW: 16 % — ABNORMAL HIGH (ref 11.2–14.5)
WBC: 5.5 10*3/uL (ref 3.9–10.3)

## 2012-08-02 LAB — COMPREHENSIVE METABOLIC PANEL (CC13)
BUN: 20 mg/dL (ref 7.0–26.0)
CO2: 22 mEq/L (ref 22–29)
Chloride: 108 mEq/L — ABNORMAL HIGH (ref 98–107)
Creatinine: 1 mg/dL (ref 0.6–1.1)
Glucose: 122 mg/dl — ABNORMAL HIGH (ref 70–99)
Total Bilirubin: 0.2 mg/dL (ref 0.20–1.20)

## 2012-08-02 LAB — CORRECTED CALCIUM (CC13): Calcium, Corrected: 9.7 mg/dL (ref 8.4–10.4)

## 2012-08-02 MED ORDER — HEPARIN SOD (PORK) LOCK FLUSH 100 UNIT/ML IV SOLN
500.0000 [IU] | Freq: Once | INTRAVENOUS | Status: AC
  Administered 2012-08-02: 500 [IU] via INTRAVENOUS
  Filled 2012-08-02: qty 5

## 2012-08-02 MED ORDER — SODIUM CHLORIDE 0.9 % IJ SOLN
10.0000 mL | INTRAMUSCULAR | Status: DC | PRN
  Administered 2012-08-02: 10 mL via INTRAVENOUS
  Filled 2012-08-02: qty 10

## 2012-08-02 NOTE — Patient Instructions (Addendum)
Keep your upcoming scheduled appointments with GYN Oncology and Radiation Oncology Continue your scheduled port a cath flushes until further notice

## 2012-08-03 ENCOUNTER — Other Ambulatory Visit: Payer: Medicare PPO

## 2012-08-03 ENCOUNTER — Ambulatory Visit: Payer: Medicare PPO | Admitting: Physician Assistant

## 2012-08-03 NOTE — Telephone Encounter (Signed)
Left message to call back about DEXA scan

## 2012-08-07 NOTE — Progress Notes (Signed)
No images are attached to the encounter. No scans are attached to the encounter. No scans are attached to the encounter. [facility] OFFICE PROGRESS NOTE  METHENEY,CATHERINE, MD [address]  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 female returns for a scheduled regular office visit for followup of her history of IIIC endometrial carcinoma. She presents for routine six-month followup visit. She voices no specific complaints today. She states that she is scheduled to see Dr. Duard Brady in GYN oncology on 08/30/2012 for followup. She is interested in having her Port-A-Cath removed if this is advised by Dr. Darrold Span. She believes it was Dr. Deanne Coffer who placed her Port-A-Cath. She reports that she is still dealing with some stress in relationship to her husband's brain tumor. He is essentially homebound. She does have someone come into the home to help care for him. 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 She reports that her recent mammogram was normal and her last Pap smear was also "normal".  MEDICAL HISTORY: Past Medical History  Diagnosis Date  . Back pain   . Lower extremity weakness   . Diverticulosis April 2012  . Depression   . Uterine cancer 2011    endometrialca  . Hx of radiation therapy 03/08/11-04/18/11& 5/29,6/04/03/11/12    external beam and intracavity brachytherapy  . Anxiety   . Arthritis     degenerative  . Obesity   . Anemia     multifactorial,r/t chemo and radiation also  . History of mammogram 06/14/11    b/l mammogram    ALLERGIES:   has no known allergies.  MEDICATIONS:    Current Outpatient Prescriptions  Medication Sig Dispense Refill  . ALPRAZolam (XANAX) 0.25 MG tablet Take 1 tablet (0.25 mg total) by mouth daily as needed for sleep.  30 tablet  0  . Cholecalciferol (VITAMIN D3) 1000 UNITS CAPS Take 2,000 Units by mouth daily.        Marland Kitchen FLUoxetine (PROZAC) 20 MG capsule Take 3 capsules (60 mg total) by mouth daily.  90 capsule  6  . Multiple Vitamin (MULTIVITAMIN) capsule Take 1 capsule by mouth daily.        . pantoprazole (PROTONIX) 40 MG tablet Take 1 tablet (40 mg total) by mouth daily.  30 tablet  6  . senna-docusate (SENOKOT-S) 8.6-50 MG per tablet Take 1 tablet by mouth daily as needed.        . traZODone (DESYREL) 50 MG tablet Take 1-3 tablets (50-150 mg total) by mouth at bedtime.  60 tablet  3  . vitamin B-12 (CYANOCOBALAMIN) 1000 MCG tablet Take 1,000 mcg by mouth daily.        . eszopiclone (LUNESTA) 2 MG TABS Take 1 tablet (2 mg total) by mouth at bedtime as needed. Take immediately before bedtime  30 tablet  1  . hydrOXYzine (ATARAX/VISTARIL) 25 MG tablet Take 1 tablet (25 mg total) by mouth 3 (three) times daily as needed for itching.  30 tablet  0  . permethrin (ELIMITE) 5 % cream       . zolpidem (AMBIEN) 5 MG tablet Take 1 tablet (5 mg  total) by mouth at bedtime as needed for sleep.  30 tablet  0    SURGICAL HISTORY:  Past Surgical History  Procedure Date  . Portacath placement Jan 2012  . Abdominal hysterectomy 2011    REVIEW OF SYSTEMS:  A comprehensive review of systems was negative.   PHYSICAL EXAMINATION: General appearance: alert, cooperative, appears stated age, no distress and morbidly obese Head: Normocephalic, without obvious abnormality, atraumatic Neck: no adenopathy, no carotid bruit, no JVD, supple, symmetrical, trachea midline and thyroid not enlarged, symmetric, no tenderness/mass/nodules Lymph nodes: Cervical, supraclavicular, and axillary nodes normal. Resp: clear to auscultation bilaterally Cardio: regular rate  and rhythm, S1, S2 normal, no murmur, click, rub or gallop GI: soft, non-tender; bowel sounds normal; no masses,  no organomegaly Extremities: extremities normal, atraumatic, no cyanosis or edema Neurologic: Alert and oriented X 3, normal strength and tone. Normal symmetric reflexes. Normal coordination and gait  ECOG PERFORMANCE STATUS: 0 - Asymptomatic  Blood pressure 121/69, pulse 71, temperature 96.9 F (36.1 C), temperature source Oral, resp. rate 18, height 5' 5.5" (1.664 m), weight 250 lb 12.8 oz (113.762 kg).  LABORATORY DATA: Lab Results  Component Value Date   WBC 5.5 08/02/2012   HGB 10.1* 08/02/2012   HCT 30.8* 08/02/2012   MCV 94.7 08/02/2012   PLT 157 08/02/2012      Chemistry      Component Value Date/Time   NA 141 08/02/2012 1441   NA 140 05/08/2012 1404   K 4.0 08/02/2012 1441   K 4.1 05/08/2012 1404   CL 108* 08/02/2012 1441   CL 104 05/08/2012 1404   CO2 22 08/02/2012 1441   CO2 26 05/08/2012 1404   BUN 20.0 08/02/2012 1441   BUN 20 05/08/2012 1404   CREATININE 1.0 08/02/2012 1441   CREATININE 1.12* 05/08/2012 1404   CREATININE 0.84 02/02/2012 0902      Component Value Date/Time   CALCIUM 8.5 05/08/2012 1404   ALKPHOS 97 08/02/2012 1441   ALKPHOS 86 05/08/2012 1404   AST 9 08/02/2012 1441   AST 13 05/08/2012 1404   ALT 10 08/02/2012 1441   ALT 12 05/08/2012 1404   BILITOT 0.20 08/02/2012 1441   BILITOT 0.3 05/08/2012 1404       RADIOGRAPHIC STUDIES:  No results found.   ASSESSMENT/PLAN: The patient is a very pleasant 66 year old white female with IIIc endometrial carcinoma treated as described above.  She's currently doing well. We'll continue to have her Port-A-Cath flushed every 8 weeks and the schedule has been laid out.  She is advised to keep her appointment with Dr. Duard Brady on 08/30/2012. We'll have a discussion with Dr. Darrold Span regarding the patient's desire to have her Port-A-Cath removed. We'll set up a six-month followup with Dr. Darrold Span She is to followup with GYN oncology  as scheduled. Her anemia which is multifactorial is stable. She also has a history of degenerative arthritis as well as a remote history of tobacco and alcohol use. The patient was discussed with Dr. Arbutus Ped, and Dr. Precious Reel absence who is in agreement with the plan    Conni Slipper, PA-C   All questions were answered. The patient knows to call the clinic with any problems, questions or concerns. We can certainly see the patient much sooner if necessary.  Addendum: I discussed the patient's desire to have her Port-A-Cath removed with Dr. Darrold Span. We will await Dr. Denman George opinion on the matter and if she is in agreement we will arrange for the patient's Port-A-Cath to  be removed.

## 2012-08-08 NOTE — Telephone Encounter (Signed)
Getting DEXA scan done this Thursday per patient

## 2012-08-14 ENCOUNTER — Ambulatory Visit (INDEPENDENT_AMBULATORY_CARE_PROVIDER_SITE_OTHER): Payer: Medicare PPO

## 2012-08-14 DIAGNOSIS — N951 Menopausal and female climacteric states: Secondary | ICD-10-CM

## 2012-08-16 ENCOUNTER — Encounter: Payer: Self-pay | Admitting: Family Medicine

## 2012-08-16 ENCOUNTER — Ambulatory Visit (INDEPENDENT_AMBULATORY_CARE_PROVIDER_SITE_OTHER): Payer: Medicare PPO | Admitting: Family Medicine

## 2012-08-16 ENCOUNTER — Telehealth: Payer: Self-pay | Admitting: Family Medicine

## 2012-08-16 VITALS — BP 132/72 | HR 74 | Wt 247.0 lb

## 2012-08-16 DIAGNOSIS — F430 Acute stress reaction: Secondary | ICD-10-CM

## 2012-08-16 DIAGNOSIS — Z9181 History of falling: Secondary | ICD-10-CM

## 2012-08-16 DIAGNOSIS — Z23 Encounter for immunization: Secondary | ICD-10-CM

## 2012-08-16 DIAGNOSIS — F438 Other reactions to severe stress: Secondary | ICD-10-CM

## 2012-08-16 NOTE — Telephone Encounter (Signed)
Please call patient and let her know that we did get the copy of her bone density report. It actually looks fantastic. She is not osteopenic or osteoporotic. Her DEXA is normal. Recommend repeat in 2 years.

## 2012-08-16 NOTE — Patient Instructions (Signed)
Home Safety and Preventing Falls Falls are a leading cause of injury and while they affect all age groups, falls have greater short-term and long-term impact on older age groups. However, falls should not be a part of life or aging. It is possible for individuals and their families to use preventive measures to significantly decrease the likelihood that anyone, especially an older adult, will fall. There are many simple measures which can make your home safer with respect to preventing falls. The following actions can help reduce falls among all members of your family and are especially important as you age, when your balance, lower limb strength, coordination, and eyesight may be declining. The use of preventive measures will help to reduce you and your family's risk of falls and serious medical consequences. OUTDOORS  Repair cracks and edges of walkways and driveways.   Remove high doorway thresholds and trim shrubbery on the main path into your home.   Ensure there is good outside lighting at main entrances and along main walkways.   Clear walkways of tools, rocks, debris, and clutter.   Check that handrails are not broken and are securely fastened. Both sides of steps should have handrails.   In the garage, be attentive to and clean up grease or oil spills on the cement. This can make the surface extremely slippery.   In winter, have leaves, snow, and ice cleared regularly.   Use sand or salt on walkways during winter months.  BATHROOM  Install grab bars by the toilet and in the tub and shower.   Use non-skid mats or decals in the tub or shower.   If unable to easily stand unsupported while showering, place a plastic non slip stool in the shower to sit on when needed.   Install night lights.   Keep floors dry and clean up all water on the floor immediately.   Remove soap buildup in tub or shower on a regular basis.   Secure bath mats with non-slip, double-sided rug tape.    Remove tripping hazards from the floors.  BEDROOMS  Install night lights.   Do not use oversized bedding.   Make sure a bedside light is easy to reach.   Keep a telephone by your bedside.   Make sure that you can get in and out of your bed easily.   Have a firm chair, with side arms, to use for getting dressed.   Remove clutter from around closets.   Store clothing, bed coverings, and other household items where you can reach them comfortably.   Remove tripping hazards from the floor.  LIVING AREAS AND STAIRWAYS  Turn on lights to avoid having to walk through dark areas.   Keep lighting uniform in each room. Place brighter lightbulbs in darker areas, including stairways.   Replace lightbulbs that burn out in stairways immediately.   Arrange furniture to provide for clear pathways.   Keep furniture in the same place.   Eliminate or tape down electrical cables in high traffic areas.   Place handrails on both sides of stairways. Use handrails when going up or down stairs.   Most falls occur on the top or bottom 3 steps.   Fix any loose handrails. Make sure handrails on both sides of the stairways are as long as the stairs.   Remove all walkway obstacles.   Coil or tape electrical cords off to the side of walking areas and out of the way. If using many extension cords, have an electrician   put in a new wall outlet to reduce or eliminate them.   Make sure spills are cleaned up quickly and allow time for drying before walking on freshly cleaned floors.   Firmly attach carpet with non-skid or two-sided tape.   Keep frequently used items within easy reach.   Remove tripping hazards such as throw rugs and clutter in walkways. Never leave objects on stairs.   Get rid of throw rugs elsewhere if possible.   Eliminate uneven floor surfaces.   Make sure couches and chairs are easy to get into and out of.   Check carpeting to make sure it is firmly attached along stairs.    Make repairs to worn or loose carpet promptly.   Select a carpet pattern that does not visually hide the edge of steps.   Avoid placing throw rugs or scatter rugs at the top or bottom of stairways, or properly secure with carpet tape to prevent slippage.   Have an electrician put in a light switch at the top and bottom of the stairs.   Get light switches that glow.   Avoid the following practices: hurrying, inattention, obscured vision, carrying large loads, and wearing slip-on shoes.   Be aware of all pets.  KITCHEN  Place items that are used frequently, such as dishes and food, within easy reach.   Keep handles on pots and pans toward the center of the stove. Use back burners when possible.   Make sure spills are cleaned up quickly and allow time for drying.   Avoid walking on wet floors.   Avoid hot utensils and knives.   Position shelves so they are not too high or low.   Place commonly used objects within easy reach.   If necessary, use a sturdy step stool with a grab bar when reaching.   Make sure electrical cables are out of the way.   Do not use floor polish or wax that makes floors slippery.  OTHER HOME FALL PREVENTION STRATEGIES  Wear low heel or rubber sole shoes that are supportive and fit well.   Wear closed toe shoes.   Know and watch for side effects of medications. Have your caregiver or pharmacist look at all your medicines, even over-the-counter medicines. Some medicines can make you sleepy or dizzy.   Exercise regularly. Exercise makes you stronger and improves your balance and coordination.   Limit use of alcohol.   Use eyeglasses if necessary and keep them clean. Have your vision checked every year.   Organize your household in a manner that minimizes the need to walk distances when hurried, or go up and down stairs unnecessarily. For example, have a phone placed on at least each floor of your home. If possible, have a phone beside each sitting  or lying area where you spend the most time at home. Keep emergency numbers posted at all phones.   Use non-skid floor wax.   When using a ladder, make sure:   The base is firm.   All ladder feet are on level ground.   The ladder is angled against the wall properly.   When climbing a ladder, face the ladder and hold the ladder rungs firmly.   If reaching, always keep your hips and body weight centered between the rails.   When using a stepladder, make sure it is fully opened and both spreaders are firmly locked.   Do not climb a closed stepladder.   Avoid climbing beyond the second step from the top   of a stepladder and the 4th rung from the top of an extension ladder.   Learn and use mobility aids as needed.   Change positions slowly. Arise slowly from sitting and lying positions. Sit on the edge of your bed before getting to your feet.   If you have a history of falls, ask someone to add color or contrast paint or tape to grab bars and handrails in your home.   If you have a history of falls, ask someone to place contrasting color strips on first and last steps.   Install an electrical emergency response system if you need one, and know how to use it.   If you have a medical or other condition that causes you to have limited physical strength, it is important that you reach out to family and friends for occasional help.  FOR CHILDREN:  If young children are in the home, use safety gates. At the top of stairs use screw-mounted gates; use pressure-mounted gates for the bottom of the stairs and doorways between rooms.   Young children should be taught to descend stairs on their stomachs, feet first, and later using the handrail.   Keep drawers fully closed to prevent them from being climbed on or pulled out entirely.   Move chairs, cribs, beds and other furniture away from windows.   Consider installing window guards on windows ground floor and up, unless they are emergency  fire exits. Make sure they have easy release mechanisms.   Consider installing special locks that only allow the window to be opened to a certain height.   Never rely on window screens to prevent falls.   Never leave babies alone on changing tables, beds or sofas. Use a changing table that has a restraining strap.   When a child can pull to a standing position, the crib mattress should be adjusted to its lowest position. There should be at least 26 inches between the top rails of the crib drop side and the mattress. Toys, bumper pads, and other objects that can be used as steps to climb out should be removed from the crib.   On bunk beds never allow a child under age 6 to sleep on the top bunk. For older children, if the upper bunk is not against a wall, use guard rails on both sides. No matter how old a child is, keep the guard rails in place on the top bunk since children roll during sleep. Do not permit horseplay on bunks.   Grass and soil surfaces beneath backyard playground equipment should be replaced with hardwood chips, shredded wood mulch, sand, pea gravel, rubber, crushed stone, or another safer material at depths of at least 9 to 12 inches.   When riding bikes or using skates, skateboards, skis, or snowboards, require children to wear helmets. Look for those that have stickers stating that they meet or exceed safety standards.   Vertical posts or pickets in deck, balcony, and stairway railings should be no more than 3 1/2 inches apart if a young baby will have access to the area. The space between horizontal rails or bars, and between the floor and the first horizontal rail or bar, should be no more than 3 1/2 inches.  Document Released: 11/04/2002 Document Revised: 11/03/2011 Document Reviewed: 09/03/2009 [employer] Patient Information 2012 [employer]. 

## 2012-08-16 NOTE — Progress Notes (Addendum)
  Subjective:    Patient ID: APPOLONIA ACKERT, female    DOB: August 02, 1946, 66 y.o.   MRN: [id number]  HPI Acute siutational depression. Husband with inoperable brain tumor.  Still on prozac 60mg .  Easily irritate.  Mood is fair.  .  sleep is also fair. She still is not getting a lot of support with her husband. She would like to have a nurse help couple days a week but right now her husband oncologist thinks it's not necessary. She is really not getting a lot of support from her family members and taking care of her husband. She is talking to her pastor frequently and is a good support system for her.   Review of Systems     Objective:   Physical Exam  Constitutional: She is oriented to person, place, and time. She appears well-developed and well-nourished.  HENT:  Head: Normocephalic and atraumatic.  Cardiovascular: Normal rate, regular rhythm and normal heart sounds.   Pulmonary/Chest: Effort normal and breath sounds normal.  Neurological: She is alert and oriented to person, place, and time.  Skin: Skin is warm and dry.  Psychiatric: She has a normal mood and affect. Her behavior is normal.          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  PHQ 9 score of 13. Will continue current regimen.  She still getting support through her pastor. We also discussed considering more formal therapy in counseling. She says she will definitely keep that in consideration. She will let me know. Otherwise if doing okay followup in 2 months. 20 minutes spent face-to-face in counseling.  Flu shot given.    Fall assessment-score of 6, moderate risk. I will mail her a handout on how to reduce fall risk in the home. She got 3 points just because of her medications and one point for her age. She got 2 points for urinary urgency.

## 2012-08-17 NOTE — Telephone Encounter (Signed)
Pt informed

## 2012-08-30 ENCOUNTER — Encounter: Payer: Self-pay | Admitting: Gynecologic Oncology

## 2012-08-30 ENCOUNTER — Ambulatory Visit: Payer: Medicare PPO | Attending: Gynecologic Oncology | Admitting: Gynecologic Oncology

## 2012-08-30 ENCOUNTER — Other Ambulatory Visit (HOSPITAL_COMMUNITY)
Admission: RE | Admit: 2012-08-30 | Discharge: 2012-08-30 | Disposition: A | Discharge status: Home / Self Care | Payer: Medicare PPO | Source: Ambulatory Visit | Attending: Gynecologic Oncology | Admitting: Gynecologic Oncology

## 2012-08-30 VITALS — BP 130/68 | HR 80 | Temp 97.8°F | Resp 18 | Ht 63.19 in | Wt 253.0 lb

## 2012-08-30 DIAGNOSIS — Z01419 Encounter for gynecological examination (general) (routine) without abnormal findings: Secondary | ICD-10-CM | POA: Insufficient documentation

## 2012-08-30 DIAGNOSIS — C541 Malignant neoplasm of endometrium: Secondary | ICD-10-CM

## 2012-08-30 DIAGNOSIS — C549 Malignant neoplasm of corpus uteri, unspecified: Secondary | ICD-10-CM | POA: Insufficient documentation

## 2012-08-30 NOTE — Progress Notes (Signed)
Consult Note: Gyn-Onc  [name] 66 y.o. female  CC:  Chief Complaint  Patient presents with  . 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  HISTORY OF PRESENT ILLNESS: This is a 66 year old who reported to Dr. Penne Lash in December 2011 with a 2-month history of uterine bleeding, the endometrial biopsy demonstrated presence of a grade 1 endometrial cancer. There was initial plan for hysterectomy with lymph node dissection. However, the patient noted to have tumor that rapidly progressed and involved the cervix. The PET scan demonstrated uterine cancer with significant cervical involvement. She received 3 cycles of neoadjuvant Taxol, carboplatin therapy, then underwent a robotic-  assisted laparoscopic hysterectomy, bilateral salpingo-oophorectomy, and lymph node sampling. Final pathology demonstrated invasive endometrioid adenocarcinoma involving the outer half of the myometrium and involving the cervical stroma, angiolymphatic invasion was present and the tumor was noted to be FIGO grade II. She subsequently received additional 3 cycles of adjuvant Taxol, carboplatin therapy, completed in March 2012.  Based on the positive lymph nodes, she received external beam radiation therapy for a dose to the pelvis area 400-500 centigrade. She then underwent intracavitary brachytherapy, which was completed in June 2012.  The cumulative dose to the proximal vagina was 600-300 centigrade.  Interval History:  We last saw her in 02/2012 for biopsies. She is overall doing fairly well she does complain of a lot of stress. Her husband has a brain tumor and he declined surgery.  He is an "invalid" at home and she is caring for him. He does have a nurse that comes out 3 times a week to help. 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   Review of Systems  No nausea, vomiting, fever, chills, or abdominal pain. No diarrhea, constipation, or lower extremity  edema. No hematochezia, hematocolpos, or hematuria. Otherwise 10-point review of systems negative.  Weight loss has  Plateaued.  She had a negative MMG 8/13 and normal bone density.     Current Meds:  Outpatient Encounter Prescriptions as of 08/30/2012  Medication Sig Dispense Refill  . Cholecalciferol (VITAMIN D3) 1000 UNITS CAPS Take 2,000 Units by mouth daily.        Marland Kitchen FLUoxetine (PROZAC) 20 MG capsule Take 3 capsules (60 mg total) by mouth daily.  90 capsule  6  . Multiple Vitamin (MULTIVITAMIN) capsule Take 1 capsule by mouth daily.        . pantoprazole (PROTONIX) 40 MG tablet Take 1 tablet (40 mg total) by mouth daily.  30 tablet  6  . senna-docusate (SENOKOT-S) 8.6-50 MG per tablet Take 1 tablet by mouth daily as needed.        . traZODone (DESYREL) 50 MG tablet Take 1-3 tablets (50-150 mg total) by mouth at bedtime.  60 tablet  3  . vitamin B-12 (CYANOCOBALAMIN) 1000 MCG tablet Take 1,000 mcg by mouth daily.        Marland Kitchen ALPRAZolam (XANAX) 0.25 MG tablet Take 1 tablet (0.25 mg total) by mouth daily as needed for sleep.  30 tablet  0  . eszopiclone (LUNESTA) 2 MG TABS Take 1 tablet (2 mg total) by mouth at bedtime as needed. Take immediately before bedtime  30 tablet  1  . zolpidem (AMBIEN) 5 MG tablet Take 1 tablet (5 mg total) by mouth at bedtime as needed for sleep.  30 tablet  0  . DISCONTD: permethrin (ELIMITE) 5 % cream  Allergy: No Known Allergies  Social Hx:   History   Social History  . Marital Status: Married    Spouse Name: N/A    Number of Children: N/A  . Years of Education: N/A   Occupational History  . Not on file.   Social History Main Topics  . Smoking status: Never Smoker   . Smokeless tobacco: Never Used  . Alcohol Use: No  . Drug Use: No  . Sexually Active: Not on file   Other Topics Concern  . Not on file   Social History Narrative  . No narrative on file    Past Surgical Hx:  Past Surgical History  Procedure Date  . Portacath  placement Jan 2012  . Abdominal hysterectomy 2011    Past Medical Hx:  Past Medical History  Diagnosis Date  . Back pain   . Lower extremity weakness   . Diverticulosis April 2012  . Depression   . Uterine cancer 2011    endometrialca  . Hx of radiation therapy 03/08/11-04/18/11& 5/29,6/04/03/11/12    external beam and intracavity brachytherapy  . Anxiety   . Arthritis     degenerative  . Obesity   . Anemia     multifactorial,r/t chemo and radiation also  . History of mammogram 06/14/11    b/l mammogram    Family Hx:  Family History  Problem Relation Age of Onset  . Lung cancer Mother   . Pulmonary fibrosis Mother   . Heart attack Father   . Diabetes Father   . Colon cancer Neg Hx   . Rectal cancer Neg Hx   . Stomach cancer Neg Hx     Vitals:  Blood pressure 130/68, pulse 80, temperature 97.8 F (36.6 C), resp. rate 18, height 5' 3.19" (1.605 m), weight 253 lb (114.76 kg).  Physical Exam:   CHEST: Clear to auscultation.  HEART: Regular rate and rhythm.  LYMPH NODE SURVEY: No cervical, supraclavicular, or inguinal adenopathy.  ABDOMEN: Soft, obese, nontender. Exam limited by habitus. 4 cm plaque of candida on right. EXTREMITIES: No clubbing, cyanosis, or edema.  PELVIC: Normal external genitalia, Bartholin's, urethral and Skene's. Atrophic vagina. Small amount of vaginal discharge. Pap smears submitted. No masses or nodularity.  RECTAL: Good tone. No cul-de-sac masses. No palpable lesions in the rectovaginal septum.   Assessment/Plan:  IMPRESSION: [name] is a 66 year old without any evidence of disease from her stage III C endometrial cancer. It is now 2 years out from her diagnosis. She would like her port removed if this scan is negative.  Her last port flush was 9/13 with Dr. Darrold Span.  PLAN: We will followup on the results of your Pap smear from today. She'll follow up with Dr. Roselind Messier in 3 months and return to see [place] in 6     Snigdha Howser A., MD 08/30/2012,  1:09 PM

## 2012-08-30 NOTE — Patient Instructions (Signed)
Return for CT scan

## 2012-09-04 ENCOUNTER — Ambulatory Visit (HOSPITAL_COMMUNITY)
Admission: RE | Admit: 2012-09-04 | Discharge: 2012-09-04 | Disposition: A | Discharge status: Home / Self Care | Payer: Medicare PPO | Source: Ambulatory Visit | Attending: Gynecologic Oncology | Admitting: Gynecologic Oncology

## 2012-09-04 DIAGNOSIS — K8020 Calculus of gallbladder without cholecystitis without obstruction: Secondary | ICD-10-CM | POA: Insufficient documentation

## 2012-09-04 DIAGNOSIS — R109 Unspecified abdominal pain: Secondary | ICD-10-CM | POA: Insufficient documentation

## 2012-09-04 DIAGNOSIS — Z90710 Acquired absence of both cervix and uterus: Secondary | ICD-10-CM | POA: Insufficient documentation

## 2012-09-04 DIAGNOSIS — K449 Diaphragmatic hernia without obstruction or gangrene: Secondary | ICD-10-CM | POA: Insufficient documentation

## 2012-09-04 DIAGNOSIS — R609 Edema, unspecified: Secondary | ICD-10-CM | POA: Insufficient documentation

## 2012-09-04 DIAGNOSIS — N200 Calculus of kidney: Secondary | ICD-10-CM | POA: Insufficient documentation

## 2012-09-04 DIAGNOSIS — C541 Malignant neoplasm of endometrium: Secondary | ICD-10-CM

## 2012-09-04 DIAGNOSIS — I517 Cardiomegaly: Secondary | ICD-10-CM | POA: Insufficient documentation

## 2012-09-04 DIAGNOSIS — C549 Malignant neoplasm of corpus uteri, unspecified: Secondary | ICD-10-CM | POA: Insufficient documentation

## 2012-09-04 DIAGNOSIS — K5730 Diverticulosis of large intestine without perforation or abscess without bleeding: Secondary | ICD-10-CM | POA: Insufficient documentation

## 2012-09-04 MED ORDER — IOHEXOL 300 MG/ML  SOLN
100.0000 mL | Freq: Once | INTRAMUSCULAR | Status: AC | PRN
  Administered 2012-09-04: 100 mL via INTRAVENOUS

## 2012-09-06 ENCOUNTER — Telehealth: Payer: Self-pay | Admitting: Family Medicine

## 2012-09-06 ENCOUNTER — Telehealth: Payer: Self-pay | Admitting: Gynecologic Oncology

## 2012-09-06 NOTE — Telephone Encounter (Signed)
Call pt: Got a copy of CT report from GYN/ONC. The pulmonary arteries look enlarged which can be a sign of pulonary hypertension so I want her to see cardiology for possible echo/US of the heart. If OK will sched with Dr. Jens Som downstairs.

## 2012-09-06 NOTE — Telephone Encounter (Signed)
Called to inform patient of CT scan results and pap smear results.  Plan to fax CT findings to patient primary care physician, Dr. Linford Arnold, to follow up on suggestions of possible pulmonary hypertension.  Patient stating that she has an appointment with Dr. Linford Arnold next week.  Pt requesting port removal since approved by Dr. Darrold Span and Dr. Duard Brady.  Plan to arrange for port removal and call the patient to schedule.  No concerns or questions voiced.  Instructed to call if for any needs or concerns.

## 2012-09-07 ENCOUNTER — Other Ambulatory Visit: Payer: Self-pay | Admitting: Family Medicine

## 2012-09-07 DIAGNOSIS — I288 Other diseases of pulmonary vessels: Secondary | ICD-10-CM

## 2012-09-07 NOTE — Progress Notes (Signed)
Referral made 

## 2012-09-07 NOTE — Telephone Encounter (Signed)
Pt aware is OK w/ referral.

## 2012-09-12 ENCOUNTER — Encounter: Payer: Self-pay | Admitting: Cardiology

## 2012-09-12 ENCOUNTER — Ambulatory Visit (INDEPENDENT_AMBULATORY_CARE_PROVIDER_SITE_OTHER): Payer: Medicare PPO | Admitting: Cardiology

## 2012-09-12 VITALS — BP 132/71 | HR 70 | Wt 252.0 lb

## 2012-09-12 DIAGNOSIS — I272 Pulmonary hypertension, unspecified: Secondary | ICD-10-CM

## 2012-09-12 DIAGNOSIS — I288 Other diseases of pulmonary vessels: Secondary | ICD-10-CM

## 2012-09-12 DIAGNOSIS — I2789 Other specified pulmonary heart diseases: Secondary | ICD-10-CM

## 2012-09-12 DIAGNOSIS — R55 Syncope and collapse: Secondary | ICD-10-CM

## 2012-09-12 DIAGNOSIS — R0600 Dyspnea, unspecified: Secondary | ICD-10-CM

## 2012-09-12 DIAGNOSIS — R0989 Other specified symptoms and signs involving the circulatory and respiratory systems: Secondary | ICD-10-CM

## 2012-09-12 NOTE — Assessment & Plan Note (Signed)
Patient is noted to have cardiac enlargement and enlarged pulmonary artery suggestive of pulmonary hypertension on her CT scan. I will arrange an echocardiogram to evaluate her right side and hopefully measure pressures if she has tricuspid regurgitation. If indeed she has pulmonary hypertension we will initiate a further workup. It should be noted that a recent CT scan showed no large pulmonary embolus. There is no suggestion of pulmonary fibrosis or COPD. There may be a contribution from obesity hypoventilation syndrome. If she has pulmonary hypertension we will consider a VQ scan, rheumatologic labs and further evaluation. The echocardiogram will also help evaluate her murmur

## 2012-09-12 NOTE — Assessment & Plan Note (Signed)
Patient is having near syncopal episodes. Echocardiogram to assess LV function and CardioNet to further assess.

## 2012-09-12 NOTE — Assessment & Plan Note (Signed)
Question contribution from obesity and deconditioning. Echocardiogram to quantify LV function.

## 2012-09-12 NOTE — Progress Notes (Signed)
HPI: 66 year old female for evaluation of cardiac enlargement and pulmonary artery enlargement. The patient has a history of uterine cancer. She recently had a CT scan of her abdomen and chest. The CT of her chest showed no metastatic disease. There was note of cardiomegaly and pulmonary artery enlargement suggestive of pulmonary hypertension. Cardiology is asked to evaluate. Laboratories in September of 2013 showed normal renal function, normal liver functions, normocytic anemia with a hemoglobin of 10.1. Patient does describe dyspnea on exertion since being diagnosed and treated for uterine cancer. This had improved but recently she has noticed increased dyspnea on exertion. There is no orthopnea or PND. No significant pedal edema. No chest pain. She also has had occasional lightheaded spells. These are sudden in onset and lasts several seconds and resolve spontaneously. She occasionally has palpitations. She has not had syncope. Because of the above we were asked to evaluate.  Current Outpatient Prescriptions  Medication Sig Dispense Refill  . Cholecalciferol (VITAMIN D3) 1000 UNITS CAPS Take 2,000 Units by mouth daily.        . Ferrous Gluconate (IRON) 240 (27 FE) MG TABS Take 1 tablet by mouth daily.      Marland Kitchen FLUoxetine (PROZAC) 20 MG capsule Take 20 mg by mouth 3 (three) times daily.      . Multiple Vitamin (MULTIVITAMIN) capsule Take 1 capsule by mouth daily.        . Naproxen Sodium (ALEVE) 220 MG CAPS Take 1 capsule by mouth daily.      . pantoprazole (PROTONIX) 40 MG tablet Take 1 tablet (40 mg total) by mouth daily.  30 tablet  6  . senna-docusate (SENOKOT-S) 8.6-50 MG per tablet Take 1 tablet by mouth daily as needed.        . traZODone (DESYREL) 50 MG tablet Take 1-3 tablets (50-150 mg total) by mouth at bedtime.  60 tablet  3  . vitamin B-12 (CYANOCOBALAMIN) 1000 MCG tablet Take 1,000 mcg by mouth daily.          No Known Allergies  Past Medical History  Diagnosis Date  . Back pain     . Diverticulosis April 2012  . Depression   . Uterine cancer 2011    endometrialca  . Hx of radiation therapy 03/08/11-04/18/11& 5/29,6/04/03/11/12    external beam and intracavity brachytherapy  . Anxiety   . Arthritis     degenerative  . Obesity   . Anemia     multifactorial,r/t chemo and radiation also  . History of mammogram 06/14/11    b/l mammogram    Past Surgical History  Procedure Date  . Portacath placement Jan 2012  . Abdominal hysterectomy 2011    History   Social History  . Marital Status: Married    Spouse Name: N/A    Number of Children: 1  . 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 Smoking status: Former Games developer  . Smokeless tobacco: Never Used  . Alcohol Use: No     Former alcoholic  . Drug Use: No  . Sexually Active: Not on file   Other Topics Concern  . Not on file   Social History Narrative  . No narrative on file    Family History  Problem Relation Age of Onset  . Lung cancer Mother   . Pulmonary fibrosis Mother   . Heart attack Father   . Diabetes Father   . Colon cancer  Neg Hx   . Rectal cancer Neg Hx   . Stomach cancer Neg Hx     ROS: fatigue but no fevers or chills, productive cough, hemoptysis, dysphasia, odynophagia, melena, hematochezia, dysuria, hematuria, rash, seizure activity, orthopnea, PND, pedal edema, claudication. Remaining systems are negative.  Physical Exam:   Blood pressure 132/71, pulse 70, weight 252 lb (114.306 kg).  General:  Well developed/obese in NAD Skin warm/dry Patient not depressed No peripheral clubbing Back-normal HEENT-normal/normal eyelids Neck supple/normal carotid upstroke bilaterally; no bruits; no JVD; no thyromegaly chest - CTA/ normal expansion CV - RRR/normal S1 and S2; no rubs or gallops;  PMI nondisplaced, 2/6 systolic murmur left sternal border. Abdomen -NT/ND, no HSM, no mass, + bowel sounds, no bruit 2+ femoral pulses, no  bruits Ext-no edema, chords, 2+ DP Neuro-grossly nonfocal  ECG sinus rhythm at a rate of 70. Normal axis. Nonspecific T-wave changes.

## 2012-09-12 NOTE — Patient Instructions (Addendum)
Your physician recommends that you schedule a follow-up appointment in: 6 WEEKS WITH DR Jens Som IN [place]  Your physician has requested that you have an echocardiogram. Echocardiography is a painless test that uses sound waves to create images of your heart. It provides your doctor with information about the size and shape of your heart and how well your heart's chambers and valves are working. This procedure takes approximately one hour. There are no restrictions for this procedure.AT THE [facility] OFFICE   Your physician has recommended that you wear an event monitor. Event monitors are medical devices that record the heart's electrical activity. Doctors most often [place] these monitors to diagnose arrhythmias. Arrhythmias are problems with the speed or rhythm of the heartbeat. The monitor is a small, portable device. You can wear one while you do your normal daily activities. This is usually used to diagnose what is causing palpitations/syncope (passing out).AT THE [place] OFFICE

## 2012-09-12 NOTE — Telephone Encounter (Signed)
Order placed

## 2012-09-13 ENCOUNTER — Other Ambulatory Visit: Payer: Self-pay | Admitting: Gynecologic Oncology

## 2012-09-13 ENCOUNTER — Other Ambulatory Visit (HOSPITAL_COMMUNITY): Payer: Medicare PPO

## 2012-09-13 DIAGNOSIS — C541 Malignant neoplasm of endometrium: Secondary | ICD-10-CM

## 2012-09-13 NOTE — Progress Notes (Signed)
Pt to have port removed per Dr. Duard Brady and patient's request.

## 2012-09-14 ENCOUNTER — Other Ambulatory Visit: Payer: Self-pay | Admitting: Radiology

## 2012-09-14 DIAGNOSIS — I272 Pulmonary hypertension, unspecified: Secondary | ICD-10-CM

## 2012-09-14 DIAGNOSIS — R55 Syncope and collapse: Secondary | ICD-10-CM

## 2012-09-14 DIAGNOSIS — R0600 Dyspnea, unspecified: Secondary | ICD-10-CM

## 2012-09-17 ENCOUNTER — Encounter (HOSPITAL_COMMUNITY): Payer: Self-pay | Admitting: Pharmacy Technician

## 2012-09-19 ENCOUNTER — Encounter (HOSPITAL_COMMUNITY): Payer: Self-pay

## 2012-09-19 ENCOUNTER — Ambulatory Visit (HOSPITAL_COMMUNITY)
Admission: RE | Admit: 2012-09-19 | Discharge: 2012-09-19 | Disposition: A | Discharge status: Home / Self Care | Payer: Medicare PPO | Source: Ambulatory Visit | Attending: Gynecologic Oncology | Admitting: Gynecologic Oncology

## 2012-09-19 DIAGNOSIS — C549 Malignant neoplasm of corpus uteri, unspecified: Secondary | ICD-10-CM | POA: Insufficient documentation

## 2012-09-19 DIAGNOSIS — Z452 Encounter for adjustment and management of vascular access device: Secondary | ICD-10-CM | POA: Insufficient documentation

## 2012-09-19 DIAGNOSIS — Z01812 Encounter for preprocedural laboratory examination: Secondary | ICD-10-CM | POA: Insufficient documentation

## 2012-09-19 DIAGNOSIS — C541 Malignant neoplasm of endometrium: Secondary | ICD-10-CM

## 2012-09-19 LAB — CBC
MCH: 30.5 pg (ref 26.0–34.0)
Platelets: 169 10*3/uL (ref 150–400)
RBC: 3.61 MIL/uL — ABNORMAL LOW (ref 3.87–5.11)
WBC: 4.4 10*3/uL (ref 4.0–10.5)

## 2012-09-19 LAB — PROTIME-INR: Prothrombin Time: 12.5 seconds (ref 11.6–15.2)

## 2012-09-19 MED ORDER — DIAZEPAM 5 MG PO TABS
ORAL_TABLET | ORAL | Status: AC
  Filled 2012-09-19: qty 2

## 2012-09-19 MED ORDER — CEFAZOLIN SODIUM-DEXTROSE 2-3 GM-% IV SOLR
2.0000 g | INTRAVENOUS | Status: AC
  Administered 2012-09-19: 2 g via INTRAVENOUS
  Filled 2012-09-19: qty 50

## 2012-09-19 MED ORDER — DIAZEPAM 5 MG PO TABS
10.0000 mg | ORAL_TABLET | Freq: Once | ORAL | Status: AC
  Administered 2012-09-19: 10 mg via ORAL

## 2012-09-19 MED ORDER — LIDOCAINE HCL 1 % IJ SOLN
INTRAMUSCULAR | Status: AC
  Filled 2012-09-19: qty 20

## 2012-09-19 MED ORDER — SODIUM CHLORIDE 0.9 % IV SOLN: INTRAVENOUS | Status: DC

## 2012-09-19 NOTE — H&P (Signed)
Chief Complaint: "I'm here for my port removal" Referring Physician:Gehrig HPI: [name] is an 66 y.o. female with prior history of endometrial cancer and has completed treatment and has had follow up confirming no evidence of residual/recurrent disease. She is referred for port removal. PMHx otherwise reviewed. No acute illness.  Past Medical History:  Past Medical History  Diagnosis Date  . Back pain   . Diverticulosis April 2012  . Depression   . Uterine cancer 2011    endometrialca  . Hx of radiation therapy 03/08/11-04/18/11& 5/29,6/04/03/11/12    external beam and intracavity brachytherapy  . Anxiety   . Arthritis     degenerative  . Obesity   . Anemia     multifactorial,r/t chemo and radiation also  . History of mammogram 06/14/11    b/l mammogram    Past Surgical History:  Past Surgical History  Procedure Date  . Portacath placement Jan 2012  . Abdominal hysterectomy 2011    Family History:  Family History  Problem Relation Age of Onset  . Lung cancer Mother   . Pulmonary fibrosis Mother   . Heart attack Father   . Diabetes Father   . Colon cancer Neg Hx   . Rectal cancer Neg Hx   . Stomach cancer Neg Hx     Social History:  reports that she has quit smoking. She has never used smokeless tobacco. She reports that she does not drink alcohol or use illicit drugs.  Allergies: No Known Allergies  Medications: Cholecalciferol (VITAMIN D3) 1000 UNITS CAPS (Taking) Sig - Route: Take 2,000 Units by mouth daily. - Oral Class: Historical Med Number of times this order has been changed since signing: 2 Order [address] Ferrous Gluconate (IRON) 240 (27 FE) MG TABS (Taking) Sig - Route: Take 1 tablet by mouth daily. - Oral Class: Historical Med Number of times this order has been changed since signing: 2 Order [address] FLUoxetine (PROZAC) 20 MG capsule (Taking) Sig - Route: Take 20 mg by mouth 3 (three) times daily. - Oral Class: Historical Med Number of times this  order has been changed since signing: 2 Order [address] Multiple Vitamin (MULTIVITAMIN) capsule (Taking) Sig - Route: Take 1 capsule by mouth daily. - Oral Class: Historical Med Number of times this order has been changed since signing: 2 Order [address] Naproxen Sodium (ALEVE) 220 MG CAPS (Taking) Sig - Route: Take 1 capsule by mouth daily. - Oral Class: Historical Med Number of times this order has been changed since signing: 2 Order [address] pantoprazole (PROTONIX) 40 MG tablet (Taking) 30 tablet 6 06/15/2012 06/15/2013 Sig - Route: Take 1 tablet (40 mg total) by mouth daily. - Oral Number of times this order has been changed since signing: 2 Order [address] senna-docusate (SENOKOT-S) 8.6-50 MG per tablet (Taking) Sig - Route: Take 1 tablet by mouth daily as needed. For constipation - Oral Class: Historical Med Number of times this order has been changed since signing: 2 Order [address] vitamin B-12 (CYANOCOBALAMIN) 1000 MCG tablet (Taking) Sig - Route: Take 1,000 mcg by mouth daily. - Oral Class: Historical Med   Please HPI for pertinent positives, otherwise complete 10 system ROS negative.  Physical Exam: Temperature 98.3 F (36.8 C). There is no height or weight on file to calculate BMI.   General Appearance:  Alert, cooperative, no distress, appears stated age  Head:  Normocephalic, without obvious abnormality, atraumatic  ENT: Unremarkable  Neck: Supple, symmetrical, trachea midline, no adenopathy,  thyroid: not enlarged, symmetric, no tenderness/mass/nodules  Lungs:   Clear to auscultation bilaterally, no w/r/r, respirations unlabored without use of accessory muscles.  Chest Wall:  Palpable port hub in right anterior chest sub-q  Heart:  Regular rate and rhythm, S1, S2 normal, no murmur, rub or gallop. Carotids 2+ without bruit.  Extremities: Extremities normal, atraumatic, no cyanosis or edema  Neurologic: Normal affect, no gross deficits.   Results for orders placed during the  hospital encounter of 09/19/12 (from the past 48 hour(s))  CBC     Status: Abnormal   Collection Time   09/19/12 11:50 AM      Component Value Range Comment   WBC 4.4  4.0 - 10.5 K/uL    RBC 3.61 (*) 3.87 - 5.11 MIL/uL    Hemoglobin 11.0 (*) 12.0 - 15.0 g/dL    HCT 82.9 (*) 56.2 - 46.0 %    MCV 92.8  78.0 - 100.0 fL    MCH 30.5  26.0 - 34.0 pg    MCHC 32.8  30.0 - 36.0 g/dL    RDW 13.0  86.5 - 78.4 %    Platelets 169  150 - 400 K/uL    No results found.  Assessment/Plan Hx endometrial cancer Discussed port removal procedure, including risks and complications. Labs pending Consent signed in chart  Brayton El PA-C 09/19/2012, 12:31 PM

## 2012-09-19 NOTE — Procedures (Signed)
RIJV PAC removal No comp 

## 2012-09-20 ENCOUNTER — Ambulatory Visit (HOSPITAL_COMMUNITY): Payer: Medicare PPO | Attending: Cardiovascular Disease

## 2012-09-20 DIAGNOSIS — R0609 Other forms of dyspnea: Secondary | ICD-10-CM | POA: Insufficient documentation

## 2012-09-20 DIAGNOSIS — I369 Nonrheumatic tricuspid valve disorder, unspecified: Secondary | ICD-10-CM | POA: Insufficient documentation

## 2012-09-20 DIAGNOSIS — R0989 Other specified symptoms and signs involving the circulatory and respiratory systems: Secondary | ICD-10-CM | POA: Insufficient documentation

## 2012-09-20 DIAGNOSIS — I288 Other diseases of pulmonary vessels: Secondary | ICD-10-CM | POA: Insufficient documentation

## 2012-09-20 DIAGNOSIS — I379 Nonrheumatic pulmonary valve disorder, unspecified: Secondary | ICD-10-CM | POA: Insufficient documentation

## 2012-09-20 DIAGNOSIS — R55 Syncope and collapse: Secondary | ICD-10-CM

## 2012-09-20 DIAGNOSIS — I359 Nonrheumatic aortic valve disorder, unspecified: Secondary | ICD-10-CM | POA: Insufficient documentation

## 2012-09-20 DIAGNOSIS — I272 Pulmonary hypertension, unspecified: Secondary | ICD-10-CM

## 2012-09-20 DIAGNOSIS — I517 Cardiomegaly: Secondary | ICD-10-CM | POA: Insufficient documentation

## 2012-09-20 DIAGNOSIS — R0600 Dyspnea, unspecified: Secondary | ICD-10-CM

## 2012-09-20 NOTE — Progress Notes (Signed)
Echocardiogram performed.  

## 2012-09-21 ENCOUNTER — Telehealth: Payer: Self-pay | Admitting: Cardiology

## 2012-09-21 NOTE — Telephone Encounter (Signed)
Pt returning nurse deb. M. Call, she can be reached at hm#

## 2012-09-21 NOTE — Telephone Encounter (Signed)
Spoke with pt, aware of echo results. 

## 2012-09-25 ENCOUNTER — Ambulatory Visit (INDEPENDENT_AMBULATORY_CARE_PROVIDER_SITE_OTHER): Payer: Medicare PPO | Admitting: Family Medicine

## 2012-09-25 ENCOUNTER — Encounter: Payer: Self-pay | Admitting: Family Medicine

## 2012-09-25 VITALS — BP 113/54 | HR 81 | Ht 63.0 in | Wt 254.0 lb

## 2012-09-25 DIAGNOSIS — J329 Chronic sinusitis, unspecified: Secondary | ICD-10-CM

## 2012-09-25 DIAGNOSIS — R42 Dizziness and giddiness: Secondary | ICD-10-CM

## 2012-09-25 DIAGNOSIS — I959 Hypotension, unspecified: Secondary | ICD-10-CM

## 2012-09-25 MED ORDER — AMOXICILLIN-POT CLAVULANATE 875-125 MG PO TABS: 1.0000 | ORAL_TABLET | Freq: Two times a day (BID) | ORAL | Status: DC

## 2012-09-25 NOTE — Patient Instructions (Signed)

## 2012-09-25 NOTE — Progress Notes (Signed)
  Subjective:    Patient ID: [name], female    DOB: 1946/06/29, 66 y.o.   MRN: [id number]  HPI 4 days os coughing, and hoarsenss. Feels bilateral congestion.  No fever.  + HA.  Occ feels really lightheaded and has been checking her BP and bottom has been gradually getting lower.  Doesn't feel dehydrated. Not on BP meds. Had her port removed and DBP was 70.  She is wearing a cardiac monitor for her lightheaded spells.     Review of Systems     Objective:   Physical Exam  Constitutional: She is oriented to person, place, and time. She appears well-developed and well-nourished.  HENT:  Head: Normocephalic and atraumatic.  Right Ear: External ear normal.  Left Ear: External ear normal.  Nose: Nose normal.  Mouth/Throat: Oropharynx is clear and moist.       TMs and canals are clear.   Eyes: Conjunctivae normal and EOM are normal. Pupils are equal, round, and reactive to light.  Neck: Neck supple. No thyromegaly present.  Cardiovascular: Normal rate, regular rhythm and normal heart sounds.   Pulmonary/Chest: Effort normal and breath sounds normal. She has no wheezes.  Lymphadenopathy:    She has no cervical adenopathy.  Neurological: She is alert and oriented to person, place, and time.  Skin: Skin is warm and dry.  Psychiatric: She has a normal mood and affect.          Assessment & Plan:  Sinusitis-will go ahead and treat with Augmentin. That is a little bit earlier she's only had symptoms for 4 days at this point in time. She has been having dizziness spells as well. Call if not significantly better in one week. Work on improving her hydration.  Low diastolic BP -unclear etiology. She's not really on blood pressure medications or diuretics. Did encourage her to work on increasing her fluid intake since she does have a dry mouth today. Certainly though this could be coming from the trazodone which she uses for sleep which can also cause a dry mouth. She is wearing a cardiac  monitor for her dizziness. But we will also check a TSH as well as repeat her electrolytes today.

## 2012-09-26 LAB — FERRITIN: Ferritin: 107 ng/mL (ref 10–291)

## 2012-09-26 LAB — VITAMIN B12: Vitamin B-12: 875 pg/mL (ref 211–911)

## 2012-09-26 LAB — COMPLETE METABOLIC PANEL WITH GFR
Alkaline Phosphatase: 76 U/L (ref 39–117)
Creat: 0.99 mg/dL (ref 0.50–1.10)
GFR, Est Non African American: 59 mL/min — ABNORMAL LOW
Glucose, Bld: 100 mg/dL — ABNORMAL HIGH (ref 70–99)
Sodium: 143 mEq/L (ref 135–145)
Total Bilirubin: 0.2 mg/dL — ABNORMAL LOW (ref 0.3–1.2)
Total Protein: 6.3 g/dL (ref 6.0–8.3)

## 2012-10-04 ENCOUNTER — Encounter (HOSPITAL_COMMUNITY): Payer: Self-pay | Admitting: *Deleted

## 2012-10-04 ENCOUNTER — Observation Stay (HOSPITAL_COMMUNITY)
Admission: EM | Admit: 2012-10-04 | Discharge: 2012-10-06 | Disposition: A | Discharge status: Home / Self Care | Payer: Medicare PPO | Attending: Internal Medicine | Admitting: Internal Medicine

## 2012-10-04 DIAGNOSIS — M79609 Pain in unspecified limb: Secondary | ICD-10-CM | POA: Insufficient documentation

## 2012-10-04 DIAGNOSIS — N939 Abnormal uterine and vaginal bleeding, unspecified: Secondary | ICD-10-CM

## 2012-10-04 DIAGNOSIS — Z90710 Acquired absence of both cervix and uterus: Secondary | ICD-10-CM | POA: Insufficient documentation

## 2012-10-04 DIAGNOSIS — F341 Dysthymic disorder: Secondary | ICD-10-CM

## 2012-10-04 DIAGNOSIS — R55 Syncope and collapse: Secondary | ICD-10-CM

## 2012-10-04 DIAGNOSIS — N898 Other specified noninflammatory disorders of vagina: Secondary | ICD-10-CM | POA: Insufficient documentation

## 2012-10-04 DIAGNOSIS — R102 Pelvic and perineal pain unspecified side: Secondary | ICD-10-CM

## 2012-10-04 DIAGNOSIS — R109 Unspecified abdominal pain: Principal | ICD-10-CM

## 2012-10-04 DIAGNOSIS — R0600 Dyspnea, unspecified: Secondary | ICD-10-CM

## 2012-10-04 DIAGNOSIS — C541 Malignant neoplasm of endometrium: Secondary | ICD-10-CM

## 2012-10-04 DIAGNOSIS — R233 Spontaneous ecchymoses: Secondary | ICD-10-CM

## 2012-10-04 DIAGNOSIS — K5730 Diverticulosis of large intestine without perforation or abscess without bleeding: Secondary | ICD-10-CM

## 2012-10-04 DIAGNOSIS — G4700 Insomnia, unspecified: Secondary | ICD-10-CM

## 2012-10-04 DIAGNOSIS — C55 Malignant neoplasm of uterus, part unspecified: Secondary | ICD-10-CM

## 2012-10-04 DIAGNOSIS — M545 Low back pain, unspecified: Secondary | ICD-10-CM

## 2012-10-04 DIAGNOSIS — I288 Other diseases of pulmonary vessels: Secondary | ICD-10-CM

## 2012-10-04 DIAGNOSIS — K219 Gastro-esophageal reflux disease without esophagitis: Secondary | ICD-10-CM

## 2012-10-04 DIAGNOSIS — Q619 Cystic kidney disease, unspecified: Secondary | ICD-10-CM | POA: Insufficient documentation

## 2012-10-04 DIAGNOSIS — D649 Anemia, unspecified: Secondary | ICD-10-CM

## 2012-10-04 DIAGNOSIS — Z79899 Other long term (current) drug therapy: Secondary | ICD-10-CM | POA: Insufficient documentation

## 2012-10-04 DIAGNOSIS — Z8542 Personal history of malignant neoplasm of other parts of uterus: Secondary | ICD-10-CM | POA: Insufficient documentation

## 2012-10-04 DIAGNOSIS — Z923 Personal history of irradiation: Secondary | ICD-10-CM

## 2012-10-04 LAB — URINALYSIS, MICROSCOPIC ONLY
Glucose, UA: NEGATIVE mg/dL
Hgb urine dipstick: NEGATIVE
Specific Gravity, Urine: 1.027 (ref 1.005–1.030)
pH: 5 (ref 5.0–8.0)

## 2012-10-04 NOTE — ED Notes (Signed)
Patient is alert and oriented x3.  She is complaining of vaginal bleeding and abdominal pain  That started today.  She has a past history uterine cancer with surgery, chemo and radiation. Currently rates her pain 7 of 10 constant.

## 2012-10-04 NOTE — ED Notes (Signed)
Upon taking pt's stockings off pt with petechiae noted to bil lower extremities. Pt states that she just noticed this new symptom. Pt states she started taking an antibiotic for sinus infection and has only 2 days left. Will continue to monitor.

## 2012-10-04 NOTE — ED Provider Notes (Signed)
History     CSN: [id number]  Arrival date & time 10/04/12  2039   First MD Initiated Contact with Patient 10/04/12 2317      Chief Complaint  Patient presents with  . Vaginal Bleeding  . Abdominal Pain    (Consider location/radiation/quality/duration/timing/severity/associated sxs/prior treatment) HPI Hx per Pt, Vag bleeding today, has h/o uterine cancer diagnosed 2011, had chemo and surgery and last chemo march 2012, radiation through July. Had complete Hysto. No F/C, did notice rash to BLEs, no rectal bleeding, no N/V. Is on ABx for sinus infection - Augmentin. Mod in severity. Blood noticed with urination and then also in under garments. Has L sided pelvic pain similar to cancer pains. Mod to severe.  Past Medical History  Diagnosis Date  . Back pain   . Diverticulosis April 2012  . Depression   . Uterine cancer 2011    endometrialca  . Hx of radiation therapy 03/08/11-04/18/11& 5/29,6/04/03/11/12    external beam and intracavity brachytherapy  . Anxiety   . Arthritis     degenerative  . Obesity   . Anemia     multifactorial,r/t chemo and radiation also  . History of mammogram 06/14/11    b/l mammogram    Past Surgical History  Procedure Date  . Portacath placement Jan 2012  . Abdominal hysterectomy 2011    Family History  Problem Relation Age of Onset  . Lung cancer Mother   . Pulmonary fibrosis Mother   . Heart attack Father   . Diabetes Father   . Colon cancer Neg Hx   . Rectal cancer Neg Hx   . Stomach cancer Neg Hx     History  Substance Use Topics  . Smoking status: Former Games developer  . Smokeless tobacco: Never Used  . Alcohol Use: No     Comment: Former alcoholic    OB History    Grav Para Term Preterm Abortions TAB SAB Ect Mult Living                  Review of Systems  Constitutional: Negative for fever and chills.  HENT: Negative for neck pain and neck stiffness.   Eyes: Negative for pain.  Respiratory: Negative for shortness of breath.     Cardiovascular: Negative for chest pain.  Gastrointestinal: Negative for abdominal pain.  Genitourinary: Positive for pelvic pain. Negative for dysuria.  Musculoskeletal: Negative for back pain.  Skin: Negative for rash.  Neurological: Negative for headaches.  All other systems reviewed and are negative.    Allergies  Review of patient's allergies indicates no known allergies.  Home Medications   Current Outpatient Rx  Name  Route  Sig  Dispense  Refill  . AMOXICILLIN-POT CLAVULANATE 875-125 MG PO TABS   Oral   Take 1 tablet by mouth 2 (two) times daily.         Marland Kitchen VITAMIN D3 1000 UNITS PO CAPS   Oral   Take 2,000 Units by mouth daily.           . IRON 240 (27 FE) MG PO TABS   Oral   Take 1 tablet by mouth daily.         Marland Kitchen FLUOXETINE HCL 20 MG PO CAPS   Oral   Take 20 mg by mouth 3 (three) times daily.         . MULTIVITAMINS PO CAPS   Oral   Take 1 capsule by mouth daily.           Marland Kitchen  PANTOPRAZOLE SODIUM 40 MG PO TBEC   Oral   Take 1 tablet (40 mg total) by mouth daily.   30 tablet   6   . SENNOSIDES-DOCUSATE SODIUM 8.6-50 MG PO TABS   Oral   Take 1 tablet by mouth daily as needed. For constipation         . TRAZODONE HCL 50 MG PO TABS   Oral   Take 50-150 mg by mouth at bedtime.         Marland Kitchen VITAMIN B-12 1000 MCG PO TABS   Oral   Take 1,000 mcg by mouth daily.             BP 122/57  Pulse 80  Temp 98.7 F (37.1 C) (Oral)  Resp 20  SpO2 99%  Physical Exam  Constitutional: She is oriented to person, place, and time. She appears well-developed and well-nourished.  HENT:  Head: Normocephalic and atraumatic.  Eyes: EOM are normal. Pupils are equal, round, and reactive to light. No scleral icterus.  Neck: Neck supple.  Cardiovascular: Normal rate, regular rhythm and intact distal pulses.   Pulmonary/Chest: Effort normal and breath sounds normal. No respiratory distress. She has no wheezes. She has no rales.  Abdominal: Soft. Bowel  sounds are normal. She exhibits no distension. There is no tenderness. There is no rebound and no guarding.  Genitourinary:       External GU exam WNL, no evidence of bleeding source  Musculoskeletal: Normal range of motion. She exhibits no edema.  Neurological: She is alert and oriented to person, place, and time.  Skin: Skin is warm and dry.       Petechial rash to BLEs, non blanching and mild TTP    ED Course  Procedures (including critical care time)  Results for orders placed during the hospital encounter of 10/04/12  CBC WITH DIFFERENTIAL      Component Value Range   WBC 4.7  4.0 - 10.5 K/uL   RBC 3.29 (*) 3.87 - 5.11 MIL/uL   Hemoglobin 9.9 (*) 12.0 - 15.0 g/dL   HCT 16.1 (*) 09.6 - 04.5 %   MCV 93.0  78.0 - 100.0 fL   MCH 30.1  26.0 - 34.0 pg   MCHC 32.4  30.0 - 36.0 g/dL   RDW 40.9  81.1 - 91.4 %   Platelets 162  150 - 400 K/uL   Neutrophils Relative 70  43 - 77 %   Neutro Abs 3.3  1.7 - 7.7 K/uL   Lymphocytes Relative 19  12 - 46 %   Lymphs Abs 0.9  0.7 - 4.0 K/uL   Monocytes Relative 6  3 - 12 %   Monocytes Absolute 0.3  0.1 - 1.0 K/uL   Eosinophils Relative 4  0 - 5 %   Eosinophils Absolute 0.2  0.0 - 0.7 K/uL   Basophils Relative 0  0 - 1 %   Basophils Absolute 0.0  0.0 - 0.1 K/uL  COMPREHENSIVE METABOLIC PANEL      Component Value Range   Sodium 137  135 - 145 mEq/L   Potassium 4.2  3.5 - 5.1 mEq/L   Chloride 101  96 - 112 mEq/L   CO2 27  19 - 32 mEq/L   Glucose, Bld 102 (*) 70 - 99 mg/dL   BUN 22  6 - 23 mg/dL   Creatinine, Ser 7.82  0.50 - 1.10 mg/dL   Calcium 8.7  8.4 - 95.6 mg/dL   Total Protein 6.5  6.0 - 8.3 g/dL   Albumin 3.2 (*) 3.5 - 5.2 g/dL   AST 11  0 - 37 U/L   ALT 9  0 - 35 U/L   Alkaline Phosphatase 77  39 - 117 U/L   Total Bilirubin 0.3  0.3 - 1.2 mg/dL   GFR calc non Af Amer 53 (*) >90 mL/min   GFR calc Af Amer 62 (*) >90 mL/min  URINALYSIS, MICROSCOPIC ONLY      Component Value Range   Color, Urine YELLOW  YELLOW   APPearance  CLEAR  CLEAR   Specific Gravity, Urine 1.027  1.005 - 1.030   pH 5.0  5.0 - 8.0   Glucose, UA NEGATIVE  NEGATIVE mg/dL   Hgb urine dipstick NEGATIVE  NEGATIVE   Bilirubin Urine NEGATIVE  NEGATIVE   Ketones, ur NEGATIVE  NEGATIVE mg/dL   Protein, ur NEGATIVE  NEGATIVE mg/dL   Urobilinogen, UA 0.2  0.0 - 1.0 mg/dL   Nitrite NEGATIVE  NEGATIVE   Leukocytes, UA NEGATIVE  NEGATIVE   Bacteria, UA RARE  RARE   Squamous Epithelial / LPF RARE  RARE  POCT I-STAT, CHEM 8      Component Value Range   Sodium 139  135 - 145 mEq/L   Potassium 4.4  3.5 - 5.1 mEq/L   Chloride 102  96 - 112 mEq/L   BUN 29 (*) 6 - 23 mg/dL   Creatinine, Ser 4.01  0.50 - 1.10 mg/dL   Glucose, Bld 027 (*) 70 - 99 mg/dL   Calcium, Ion 2.53 (*) 1.13 - 1.30 mmol/L   TCO2 26  0 - 100 mmol/L   Hemoglobin 9.5 (*) 12.0 - 15.0 g/dL   HCT 66.4 (*) 40.3 - 47.4 %   US Transvaginal Non-ob  10/05/2012  *RADIOLOGY REPORT*  Clinical Data: Vaginal bleeding.  History of hysterectomy.  TRANSABDOMINAL AND TRANSVAGINAL ULTRASOUND OF PELVIS Technique:  Both transabdominal and transvaginal ultrasound examinations of the pelvis were performed. Transabdominal technique was performed for global imaging of the pelvis including uterus, ovaries, adnexal regions, and pelvic cul-de-sac.  It was necessary to proceed with endovaginal exam following the transabdominal exam to visualize the remaining pelvic structures in greater detail.  Comparison:  None  Findings:  Uterus: Status post hysterectomy.  No definite abnormalities seen at the vaginal cuff.  Endometrium: N/A  Right ovary:  Status post right-sided salpingo-oophorectomy.  Left ovary: Status post left-sided salpingo-oophorectomy.  Other findings: No free fluid is seen within the pelvic cul-de-sac.  IMPRESSION: Unremarkable pelvic ultrasound, status post hysterectomy and bilateral salpingo-oophorectomy.   Original Report Authenticated By: Tonia Ghent, M.D.    US Pelvis Complete  10/05/2012   *RADIOLOGY REPORT*  Clinical Data: Vaginal bleeding.  History of hysterectomy.  TRANSABDOMINAL AND TRANSVAGINAL ULTRASOUND OF PELVIS Technique:  Both transabdominal and transvaginal ultrasound examinations of the pelvis were performed. Transabdominal technique was performed for global imaging of the pelvis including uterus, ovaries, adnexal regions, and pelvic cul-de-sac.  It was necessary to proceed with endovaginal exam following the transabdominal exam to visualize the remaining pelvic structures in greater detail.  Comparison:  None  Findings:  Uterus: Status post hysterectomy.  No definite abnormalities seen at the vaginal cuff.  Endometrium: N/A  Right ovary:  Status post right-sided salpingo-oophorectomy.  Left ovary: Status post left-sided salpingo-oophorectomy.  Other findings: No free fluid is seen within the pelvic cul-de-sac.  IMPRESSION: Unremarkable pelvic ultrasound, status post hysterectomy and bilateral salpingo-oophorectomy.   Original Report Authenticated By: Tonia Ghent, M.D.  2:47 AM d/w Dr Blima Rich, requests INR, LDH, sed rate and Ct A/P - will admit   MDM   Pelvic pain/ petechial rash/ vag bleeding and h/o uterine ca, on ABx, labs as above - plan MED admit.         Sunnie Nielsen, MD 10/05/12 [phone]

## 2012-10-05 ENCOUNTER — Encounter (HOSPITAL_COMMUNITY): Payer: Self-pay | Admitting: Internal Medicine

## 2012-10-05 ENCOUNTER — Emergency Department (HOSPITAL_COMMUNITY): Payer: Medicare PPO

## 2012-10-05 DIAGNOSIS — R233 Spontaneous ecchymoses: Secondary | ICD-10-CM

## 2012-10-05 DIAGNOSIS — R109 Unspecified abdominal pain: Secondary | ICD-10-CM | POA: Diagnosis present

## 2012-10-05 DIAGNOSIS — N939 Abnormal uterine and vaginal bleeding, unspecified: Secondary | ICD-10-CM | POA: Diagnosis present

## 2012-10-05 DIAGNOSIS — C549 Malignant neoplasm of corpus uteri, unspecified: Secondary | ICD-10-CM

## 2012-10-05 DIAGNOSIS — N898 Other specified noninflammatory disorders of vagina: Secondary | ICD-10-CM

## 2012-10-05 DIAGNOSIS — R609 Edema, unspecified: Secondary | ICD-10-CM

## 2012-10-05 DIAGNOSIS — D649 Anemia, unspecified: Secondary | ICD-10-CM

## 2012-10-05 LAB — COMPREHENSIVE METABOLIC PANEL
AST: 11 U/L (ref 0–37)
Albumin: 3.2 g/dL — ABNORMAL LOW (ref 3.5–5.2)
Calcium: 8.7 mg/dL (ref 8.4–10.5)
Chloride: 101 mEq/L (ref 96–112)
Creatinine, Ser: 1.06 mg/dL (ref 0.50–1.10)

## 2012-10-05 LAB — IRON AND TIBC: Iron: 40 ug/dL — ABNORMAL LOW (ref 42–135)

## 2012-10-05 LAB — CBC WITH DIFFERENTIAL/PLATELET
Basophils Absolute: 0 10*3/uL (ref 0.0–0.1)
Lymphocytes Relative: 19 % (ref 12–46)
Lymphs Abs: 0.9 10*3/uL (ref 0.7–4.0)
MCV: 93 fL (ref 78.0–100.0)
Neutro Abs: 3.3 10*3/uL (ref 1.7–7.7)
Neutrophils Relative %: 70 % (ref 43–77)
Platelets: 162 10*3/uL (ref 150–400)
RBC: 3.29 MIL/uL — ABNORMAL LOW (ref 3.87–5.11)
WBC: 4.7 10*3/uL (ref 4.0–10.5)

## 2012-10-05 LAB — POCT I-STAT, CHEM 8
Glucose, Bld: 101 mg/dL — ABNORMAL HIGH (ref 70–99)
HCT: 28 % — ABNORMAL LOW (ref 36.0–46.0)
Hemoglobin: 9.5 g/dL — ABNORMAL LOW (ref 12.0–15.0)
Potassium: 4.4 mEq/L (ref 3.5–5.1)
Sodium: 139 mEq/L (ref 135–145)

## 2012-10-05 LAB — RETICULOCYTES: Retic Count, Absolute: 62.5 10*3/uL (ref 19.0–186.0)

## 2012-10-05 LAB — PROTIME-INR: Prothrombin Time: 12.3 seconds (ref 11.6–15.2)

## 2012-10-05 LAB — LACTATE DEHYDROGENASE: LDH: 178 U/L (ref 94–250)

## 2012-10-05 MED ORDER — ONDANSETRON HCL 4 MG/2ML IJ SOLN: 4.0000 mg | Freq: Four times a day (QID) | INTRAMUSCULAR | Status: DC | PRN

## 2012-10-05 MED ORDER — VITAMIN B-12 1000 MCG PO TABS
1000.0000 ug | ORAL_TABLET | Freq: Every day | ORAL | Status: DC
  Administered 2012-10-05 – 2012-10-06 (×2): 1000 ug via ORAL
  Filled 2012-10-05 (×2): qty 1

## 2012-10-05 MED ORDER — ONDANSETRON HCL 4 MG PO TABS: 4.0000 mg | ORAL_TABLET | Freq: Four times a day (QID) | ORAL | Status: DC | PRN

## 2012-10-05 MED ORDER — IOHEXOL 300 MG/ML  SOLN
100.0000 mL | Freq: Once | INTRAMUSCULAR | Status: AC | PRN
  Administered 2012-10-05: 100 mL via INTRAVENOUS

## 2012-10-05 MED ORDER — SODIUM CHLORIDE 0.9 % IV SOLN
INTRAVENOUS | Status: DC
  Administered 2012-10-05 (×2): 75 mL/h via INTRAVENOUS

## 2012-10-05 MED ORDER — FERROUS GLUCONATE 324 (38 FE) MG PO TABS
324.0000 mg | ORAL_TABLET | Freq: Every day | ORAL | Status: DC
  Administered 2012-10-05 – 2012-10-06 (×2): 324 mg via ORAL
  Filled 2012-10-05 (×2): qty 1

## 2012-10-05 MED ORDER — ACETAMINOPHEN 650 MG RE SUPP: 650.0000 mg | Freq: Four times a day (QID) | RECTAL | Status: DC | PRN

## 2012-10-05 MED ORDER — MORPHINE SULFATE 2 MG/ML IJ SOLN
1.0000 mg | INTRAMUSCULAR | Status: DC | PRN
  Administered 2012-10-05 (×2): 1 mg via INTRAVENOUS
  Filled 2012-10-05 (×2): qty 1

## 2012-10-05 MED ORDER — TRAZODONE HCL 50 MG PO TABS: 50.0000 mg | ORAL_TABLET | Freq: Every day | ORAL | Status: DC

## 2012-10-05 MED ORDER — TRAZODONE HCL 50 MG PO TABS
50.0000 mg | ORAL_TABLET | Freq: Every evening | ORAL | Status: DC | PRN
  Administered 2012-10-05 (×2): 50 mg via ORAL
  Filled 2012-10-05 (×4): qty 1

## 2012-10-05 MED ORDER — OXYCODONE-ACETAMINOPHEN 5-325 MG PO TABS
1.0000 | ORAL_TABLET | Freq: Four times a day (QID) | ORAL | Status: DC | PRN
  Administered 2012-10-05: 1 via ORAL
  Filled 2012-10-05: qty 1

## 2012-10-05 MED ORDER — HYDROMORPHONE HCL PF 1 MG/ML IJ SOLN
1.0000 mg | Freq: Once | INTRAMUSCULAR | Status: AC
  Administered 2012-10-05: 1 mg via INTRAVENOUS
  Filled 2012-10-05: qty 1

## 2012-10-05 MED ORDER — ONDANSETRON HCL 4 MG/2ML IJ SOLN
4.0000 mg | Freq: Once | INTRAMUSCULAR | Status: AC
  Administered 2012-10-05: 4 mg via INTRAVENOUS
  Filled 2012-10-05: qty 2

## 2012-10-05 MED ORDER — SODIUM CHLORIDE 0.9 % IV SOLN
INTRAVENOUS | Status: DC
  Administered 2012-10-05: 01:00:00 via INTRAVENOUS

## 2012-10-05 MED ORDER — BACITRACIN-NEOMYCIN-POLYMYXIN 400-5-5000 EX OINT
TOPICAL_OINTMENT | Freq: Two times a day (BID) | CUTANEOUS | Status: DC
  Administered 2012-10-05 (×2): 1 via TOPICAL
  Administered 2012-10-06: 10:00:00 via TOPICAL
  Filled 2012-10-05: qty 1

## 2012-10-05 MED ORDER — FLUOXETINE HCL 20 MG PO CAPS
20.0000 mg | ORAL_CAPSULE | Freq: Three times a day (TID) | ORAL | Status: DC
  Administered 2012-10-05 – 2012-10-06 (×4): 20 mg via ORAL
  Filled 2012-10-05 (×6): qty 1

## 2012-10-05 MED ORDER — PANTOPRAZOLE SODIUM 40 MG PO TBEC
40.0000 mg | DELAYED_RELEASE_TABLET | Freq: Every day | ORAL | Status: DC
  Administered 2012-10-05 – 2012-10-06 (×2): 40 mg via ORAL
  Filled 2012-10-05 (×2): qty 1

## 2012-10-05 MED ORDER — IRON 240 (27 FE) MG PO TABS: 1.0000 | ORAL_TABLET | Freq: Every day | ORAL | Status: DC

## 2012-10-05 MED ORDER — ACETAMINOPHEN 325 MG PO TABS: 650.0000 mg | ORAL_TABLET | Freq: Four times a day (QID) | ORAL | Status: DC | PRN

## 2012-10-05 NOTE — Progress Notes (Signed)
[facility]             Progress Note   Subjective: Feels "anxious" today. Would like to stay in the hospital until Sunday. She says she needs to rest because she has a disabled husband at home. Have spoken with patient, husband and pastor present in the room at length regarding her plan of care.  Objective: Vital signs in last 24 hours: Temp:  [98.2 F (36.8 C)-99.5 F (37.5 C)] 98.2 F (36.8 C) (11/08 1000) Pulse Rate:  [64-89] 64  (11/08 1000) Resp:  [18-20] 18  (11/08 1000) BP: (114-158)/(56-86) 131/63 mmHg (11/08 1000) SpO2:  [91 %-99 %] 97 % (11/08 1000) Weight:  [117.028 kg (258 lb)-117.073 kg (258 lb 1.6 oz)] 117.028 kg (258 lb) (11/08 0748) Weight change:  Last BM Date: 10/04/12  Intake/Output from previous day: 11/07 0701 - 11/08 0700 In: -  Out: 500 [Urine:500] Total I/O In: 226.3 [I.V.:226.3] Out: 500 [Urine:500]   Physical Exam: General: Alert, awake, oriented x3, in no acute distress. HEENT: No bruits, no goiter. Heart: Regular rate and rhythm, without murmurs, rubs, gallops. Lungs: Clear to auscultation bilaterally. Abdomen: Soft, nontender, nondistended, positive bowel sounds. Extremities: Bilateral petechial-looking rash on her lower extremities. Neuro: Grossly intact, nonfocal.    Lab Results: Basic Metabolic Panel:  Basename 10/05/12 0032 10/05/12 0015  NA 139 137  K 4.4 4.2  CL 102 101  CO2 -- 27  GLUCOSE 101* 102*  BUN 29* 22  CREATININE 1.10 1.06  CALCIUM -- 8.7  MG -- --  PHOS -- --   Liver Function Tests:  [facility] 10/05/12 0015  AST 11  ALT 9  ALKPHOS 77  BILITOT 0.3  PROT 6.5  ALBUMIN 3.2*   CBC:  Basename 10/05/12 0032 10/05/12 0015  WBC -- 4.7  NEUTROABS -- 3.3  HGB 9.5* 9.9*  HCT 28.0* 30.6*  MCV -- 93.0  PLT -- 162   Anemia Panel:  Basename 10/05/12 0015  VITAMINB12 732  FOLATE --  FERRITIN 88  TIBC 242*  IRON 40*  RETICCTPCT 1.9   Coagulation:  Basename 10/05/12 0250  LABPROT 12.3    INR 0.92   Urinalysis:  Basename 10/04/12 2305  COLORURINE YELLOW  LABSPEC 1.027  PHURINE 5.0  GLUCOSEU NEGATIVE  HGBUR NEGATIVE  BILIRUBINUR NEGATIVE  KETONESUR NEGATIVE  PROTEINUR NEGATIVE  UROBILINOGEN 0.2  NITRITE NEGATIVE  LEUKOCYTESUR NEGATIVE    Studies/Results: US Transvaginal Non-ob  10/05/2012  *RADIOLOGY REPORT*  Clinical Data: Vaginal bleeding.  History of hysterectomy.  TRANSABDOMINAL AND TRANSVAGINAL ULTRASOUND OF PELVIS Technique:  Both transabdominal and transvaginal ultrasound examinations of the pelvis were performed. Transabdominal technique was performed for global imaging of the pelvis including uterus, ovaries, adnexal regions, and pelvic cul-de-sac.  It was necessary to proceed with endovaginal exam following the transabdominal exam to visualize the remaining pelvic structures in greater detail.  Comparison:  None  Findings:  Uterus: Status post hysterectomy.  No definite abnormalities seen at the vaginal cuff.  Endometrium: N/A  Right ovary:  Status post right-sided salpingo-oophorectomy.  Left ovary: Status post left-sided salpingo-oophorectomy.  Other findings: No free fluid is seen within the pelvic cul-de-sac.  IMPRESSION: Unremarkable pelvic ultrasound, status post hysterectomy and bilateral salpingo-oophorectomy.   Original Report Authenticated By: Tonia Ghent, M.D.    US Pelvis Complete  10/05/2012  *RADIOLOGY REPORT*  Clinical Data: Vaginal bleeding.  History of hysterectomy.  TRANSABDOMINAL AND TRANSVAGINAL ULTRASOUND OF PELVIS Technique:  Both transabdominal and transvaginal ultrasound examinations of the pelvis were  performed. Transabdominal technique was performed for global imaging of the pelvis including uterus, ovaries, adnexal regions, and pelvic cul-de-sac.  It was necessary to proceed with endovaginal exam following the transabdominal exam to visualize the remaining pelvic structures in greater detail.  Comparison:  None  Findings:  Uterus: Status  post hysterectomy.  No definite abnormalities seen at the vaginal cuff.  Endometrium: N/A  Right ovary:  Status post right-sided salpingo-oophorectomy.  Left ovary: Status post left-sided salpingo-oophorectomy.  Other findings: No free fluid is seen within the pelvic cul-de-sac.  IMPRESSION: Unremarkable pelvic ultrasound, status post hysterectomy and bilateral salpingo-oophorectomy.   Original Report Authenticated By: Tonia Ghent, M.D.    Ct Abdomen Pelvis W Contrast  10/05/2012  *RADIOLOGY REPORT*  Clinical Data: Lower abdominal pain and vaginal bleeding.  CT ABDOMEN AND PELVIS WITH CONTRAST  Technique:  Multidetector CT imaging of the abdomen and pelvis was performed following the standard protocol during bolus administration of intravenous contrast.  Contrast: OMNIPAQUE IOHEXOL 300 MG/ML  SOLN  Comparison: CT of the abdomen and pelvis performed 09/04/2012  Findings: Minimal bibasilar atelectasis is noted.  The liver and spleen are unremarkable in appearance.  A stone is noted within the gallbladder; the gallbladder is otherwise unremarkable in appearance.  The pancreas and adrenal glands are unremarkable.  Mild nonspecific perinephric stranding is noted.  There is an exophytic 3.0 cm cyst arising at the lower pole of the right kidney.  The kidneys are otherwise unremarkable in appearance. There is no evidence of hydronephrosis.  No renal or ureteral stones are seen.  No free fluid is identified.  The small bowel is unremarkable in appearance.  The stomach is within normal limits.  No acute vascular abnormalities are seen.  Mild scattered calcification is noted along the abdominal aorta.  The appendix is very diminutive in appearance, without evidence for appendicitis.  Contrast progresses to the level of the descending colon.  Minimal diverticulosis is noted along the distal descending colon, and scattered diverticulosis is seen along the proximal sigmoid colon, without evidence of diverticulitis.   The bladder is moderately distended and grossly unremarkable in appearance.  The patient is status post hysterectomy.  No suspicious adnexal masses are seen.  No inguinal lymphadenopathy is seen.  No acute osseous abnormalities are identified.  IMPRESSION:  1.  No acute abnormality seen within the abdomen or pelvis. 2.  Scattered diverticulosis noted along the sigmoid and distal descending colon, without evidence of diverticulitis. 3.  Exophytic right renal cyst again seen.   Original Report Authenticated By: Tonia Ghent, M.D.     Medications: Scheduled Meds:   . ferrous gluconate  324 mg Oral Daily  . FLUoxetine  20 mg Oral TID  . [COMPLETED]  HYDROmorphone (DILAUDID) injection  1 mg Intravenous Once  . neomycin-bacitracin-polymyxin   Topical BID  . [COMPLETED] ondansetron  4 mg Intravenous Once  . pantoprazole  40 mg Oral Daily  . traZODone  50 mg Oral QHS,MR X 1  . vitamin B-12  1,000 mcg Oral Daily  . [DISCONTINUED] Iron  1 tablet Oral Daily  . [DISCONTINUED] traZODone  50-150 mg Oral QHS   Continuous Infusions:   . sodium chloride 75 mL/hr (10/05/12 0659)  . [DISCONTINUED] sodium chloride 125 mL/hr at 10/05/12 0110   PRN Meds:.acetaminophen, acetaminophen, [COMPLETED] iohexol, morphine injection, ondansetron (ZOFRAN) IV, ondansetron, oxyCODONE-acetaminophen  Assessment/Plan:  Principal Problem:  *Abdominal pain Active Problems:  Anemia  Vaginal bleeding  Petechial rash    Vaginal Bleeding -In a patient with  a h/o endometrial cancer s/p hysterectomy. -CT scan and transvaginal US do not show evidence for recurrence of disease. -Has not had any further bleeding. -Has already been seen by GYN-Oncology with findings of a "sore" on her right labia. This is the presumed source of bleeding. -No treatment recommended other than good hygiene and triple-antibiotic cream. -Appreciate GYN-ONC assistance and recommendations.  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 -Has been  stable. -Will DC cyclic H and Hs. -No indication for transfusion.  Petechial-looking Rash on Bilateral Lower Extremities -Etiology unclear at this point. -Patient states it appeared all of a sudden yesterday evening. -Also states it is very painful to touch; however during exam if I would not tell her I was touching her legs she did not have pain. If I told her I was going to examine her legs she wouldn't even let me brush her skin because of "severe pain and burning". -She does not have thrombocytopenia. ESR is only mildly elevated. -I have explained to patient that this does not appear concerning or life-threatening. -Very difficult to get inpatient dermatology. -Plan will be for her to see a dermatologist OP to consider a skin biopsy if this persists more than 10-14 days.  Disposition -She appears overwhelmed with her home situation and the care of her disabled husband. -She is requesting to stay in the hospital until Sunday for "rest". -Have explained that is not possible. -Plan is to DC in am unless overnight events occur that prevent this.   Time spent coordinating care: 45 minutes.   LOS: 1 day   [facility] Pager: [phone] 10/05/2012, 2:30 PM

## 2012-10-05 NOTE — Progress Notes (Signed)
Consult Note: Gyn-Onc  [name] 66 y.o. female  CC:  Chief Complaint  Patient presents with  . Vaginal Bleeding  . Abdominal Pain   HPI:  [name] is a 66 year old who initially presented to Dr. Penne Lash in December 2011 with a 72-month history of uterine bleeding.  An endometrial biopsy performed at that time revealed the presence of a grade 1 endometrial cancer.  She then underwent a PET scan that demonstrated uterine cancer with significant cervical involvement. Initially, she received 3 cycles of neoadjuvant Taxol/ carboplatin therapy followed by a robotic- assisted laparoscopic hysterectomy, bilateral salpingo-oophorectomy, and lymph node sampling in December 2011.  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  Angiolymphatic invasion was present and the tumor was noted to be FIGO grade II. Subsequently, she received additional 3 cycles of adjuvant Taxol/ Carboplatin therapy, which was completed in March 2012.  Furthermore, she received external beam radiation therapy for a dose to the pelvis area 400-500 centigrade followed by intracavitary brachytherapy, which was completed in June 2012.  Her last office visit with Dr. Duard Brady was unremarkable with no evidence of disease.  A CT scan of the chest, abdomen, and pelvic was performed in October 2013 following that visit with findings of no evidence of an acute process or metastatic disease.  She then underwent port removal per patient request and was to follow up with her primary care physician about the CT chest findings of cardiomegaly and pulmonary artery enlargement suggests pulmonary arterial hypertension.  Interval History:  [name] presented to the emergency department at [facility] on the evening of October 04, 2012 with complaints of abdominal pain and vaginal bleeding.  She reports that two weeks prior, she saw her primary care  physician who placed her on Augmentin for 10 days for treatment of a sinus infection.  Stating that she had taken Augmentin once in the past with no reactions that she knew of.  Reporting the development of bilateral lower abdominal pain yesterday afternoon that reminded her of a "menstrual cycle."  Reporting the abdominal pain as constant with a 1 to 10 scale rating of a 7.  She reports noticing a minimal amount of vaginal bleeding noted on the right, upper portion of her peri-pad yesterday afternoon also.  She states that the "pain came first, then I noticed the bleeding and it scared me."  Furthermore, reporting the development of a bilateral lower extremity rash yesterday around 3 pm when removing her nylon stockings.  Stating that the rash "burns all of the time" along with being "very painful."  Denies having a rash anywhere else on her body.  Denies any recent change in activity or routine that could possibly be related to the development of the rash.  Overall, she denies urethral bleeding, rectal bleeding, vulvar lesions, vaginal drainage, or bright red bleeding from the vaginal.  She denies nausea, vomiting, chest pain, hemoptysis, or change in bowel movements.  Reporting resolution of abdominal pain during assessment.   Review of Systems Constitutional: Feels tired.  "Has not slept since Wednesday."  No fever or malaise.  Skin: No sores, jaundice, itching, dryness.  Rash on BLE that "burns all the time."  Cardiovascular: No chest pain, shortness of breath, or edema.  Pulmonary: No wheeze.  Reporting intermittent, productive cough present for the past two weeks related to recent sinus infection per patient.  Gastro Intestinal: No nausea, vomiting, constipation, or diarrhea reported. No  bright red blood per rectum or change in bowel movement.  Denies abdominal pain at this time  Genitourinary: No frequency, urgency, or dysuria.  Musculoskeletal: No myalgia, arthralgia, joint swelling, or pain.    Neurologic: No weakness, numbness, or change in gait  Psychology: Anxiety related to social stressors.  Requesting to stay in the hospital till Sunday so she "can get some rest."  Allergy: No Known Allergies  Social Hx:   History   Social History  . Marital Status: Married    Spouse Name: N/A    Number of Children: 1  . 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 Smoking status: Former Games developer  . Smokeless tobacco: Never Used  . Alcohol Use: No     Comment: Former alcoholic  . Drug Use: No  . Sexually Active: Not on file   Other Topics Concern  . Not on file   Social History Narrative  . No narrative on file   Past Surgical Hx:  Past Surgical History  Procedure Date  . Portacath placement Jan 2012  . Abdominal hysterectomy 2011   Past Medical Hx:  Past Medical History  Diagnosis Date  . Back pain   . Diverticulosis April 2012  . Depression   . Uterine cancer 2011    endometrialca  . Hx of radiation therapy 03/08/11-04/18/11& 5/29,6/04/03/11/12    external beam and intracavity brachytherapy  . Anxiety   . Arthritis     degenerative  . Obesity   . Anemia     multifactorial,r/t chemo and radiation also  . History of mammogram 06/14/11    b/l mammogram   Family Hx:  Family History  Problem Relation Age of Onset  . Lung cancer Mother   . Pulmonary fibrosis Mother   . Heart attack Father   . Diabetes Father   . Colon cancer Neg Hx   . Rectal cancer Neg Hx   . Stomach cancer Neg Hx     Vitals:  Blood pressure 131/63, pulse 64, temperature 98.2 F (36.8 C), temperature source Oral, resp. rate 18, height 5\' 4"  (1.626 m), weight 258 lb (117.028 kg), SpO2 97.00%.  Physical Exam:  GENERAL:  Alert, oriented x 3.  In no acute distress. CHEST: Clear to auscultation.  No wheezes or rales.  HEART: Regular rate and rhythm.  Systolic murmur noted (pt stating that she was recently told she had a murmur).  LYMPH  NODE SURVEY: No cervical, supraclavicular, or inguinal adenopathy.  ABDOMEN: Soft, obese, nontender.  Active bowel sounds in all quadrants.  EXTREMITIES: No clubbing or cyanosis.  Mild bilateral pedal edema noted.  Petechial rash noted on bilateral lower extremities, extending to mid-calf.  No drainage or alteration in warmth compared to normal skin noted.   PELVIC: Minimal amount of serosanguinous drainage noted on peri-pad in the front, right section during examination.  To the right of the right labia majora, a small sore was noted with minimal amount of serous drainage present.  Tender to the touch.  Appearing like a sebaceous cyst that had ruptured.  Surrounding tissue soft.  No signs of infection.  Bartholin's, urethral and Skene's normal in appearance.  Atrophic vagina. No masses, lesions, vaginal discharge, or vaginal bleeding noted.  Assessment/Plan:  [name] is a 66 year old without any evidence of disease from her stage III C endometrial cancer.  CT scan and ultrasound during admission with no evidence of acute  processes or metastatic disease.  No acute problem can be identified at this time from a gynecologic oncology standpoint.  Appointment was arranged for patient to see Dr. Duard Brady on Thursday, November 14 for follow up.  Dr. Duard Brady will be notified at [facility] of the patient's admission.  It is recommended that the patient place triple antibiotic ointment to the small sore lateral to the right labia majora, keep the area clean and dry, and use a peri-bottle for cleansing after using the restroom.  Spoke with Dr. Penne Lash with [facility] about the above assessment findings.    CROSS, MELISSA DEAL, NP 10/05/2012, 12:20 PM

## 2012-10-05 NOTE — Progress Notes (Signed)
Paged Dr. Ardyth Harps that patient states she is a DNR at home and is a full code here also that patient c/o severe burning to legs and wants some kind of lotion, MD called back states to make patient a DNR also verified by Lauris Poag, RN and MD states she has observed the same behavior as I have today that patient will not speak about legs hurting if they are touched when she is on the phone or without her knowing that her leg is going to be touched but if she is watching you will scream out that her legs hurt. Patient now has visitors and had several blankets on bed and was sleeping,started kicking at covers and screaming that her legs burned, given percocet and visitor states patient in much pain. Patient currently sitting up on side of bed eating at this time. MD states patient can have regular lotion if she wishes, otherwise continue monitoring, MD believes may be anxiety related.

## 2012-10-05 NOTE — Progress Notes (Signed)
Patient done eating, states she feels much better, no further burning to legs and requests covers be replaced.

## 2012-10-05 NOTE — Progress Notes (Signed)
Spoke to Dr. Ardyth Harps on floor states she will call patient son and speak with him, also iformed that patient legs very tender and could not stand to have pulses assessed this am, MD states ok to leave off SCD

## 2012-10-05 NOTE — H&P (Signed)
[name] is an 66 y.o. female.   Patient was seen and examined on October 05, 2012. PCP - Dr. Eppie Gibson of [facility]. Chief Complaint: Abdominal pain and vaginal bleeding. HPI: 66 year-old female with history of uterine cancer status post abdominal hysterectomy and radiation 2 years ago presents with complaints of abdominal pain and vaginal bleeding since yesterday afternoon. Patient started developing some bilateral flank pain after lifting some weight. She initially thought it pain was due to sprain due to lifting weights. But the pain persisted and after that and she went to the bathroom she had some vaginal bleed. Since the pain was persistent she came to the ER. In the ER pelvic ultrasound was unremarkable and labs show hemoglobin has decreased by 2 g from previous. Patient while in the ER on exam was found to have bilateral lower extremity petechial rash which was tender to touch. The petechial rash involved the area where she wore the stockings. Patient denies any fever chills or any trauma. The area was tender to touch and it is never happened before. The only new medication patient is taking his Augmentin for sinus infection 10 days ago and today she was completing the course of antibiotics. Patient has no rash anywhere else on the body. Denies any rectal bleeding area the pain in the abdomen improved with pain relief medications and presently is nontender. Denies any chest pain or shortness of breath or any hemoptysis. Patient is having good mobility of her lower extremities.  Past Medical History  Diagnosis Date  . Back pain   . Diverticulosis April 2012  . Depression   . Uterine cancer 2011    endometrialca  . Hx of radiation therapy 03/08/11-04/18/11& 5/29,6/04/03/11/12    external beam and intracavity brachytherapy  . Anxiety   . Arthritis     degenerative  . Obesity   . Anemia     multifactorial,r/t chemo and radiation also  . History of mammogram 06/14/11    b/l mammogram     Past Surgical History  Procedure Date  . Portacath placement Jan 2012  . Abdominal hysterectomy 2011    Family History  Problem Relation Age of Onset  . Lung cancer Mother   . Pulmonary fibrosis Mother   . Heart attack Father   . Diabetes Father   . Colon cancer Neg Hx   . Rectal cancer Neg Hx   . Stomach cancer Neg Hx    Social History:  reports that she has quit smoking. She has never used smokeless tobacco. She reports that she does not drink alcohol or use illicit drugs.  Allergies: No Known Allergies   (Not in a hospital admission)  Results for orders placed during the hospital encounter of 10/04/12 (from the past 48 hour(s))  URINALYSIS, MICROSCOPIC ONLY     Status: Normal   Collection Time   10/04/12 11:05 PM      Component Value Range Comment   Color, Urine YELLOW  YELLOW    APPearance CLEAR  CLEAR    Specific Gravity, Urine 1.027  1.005 - 1.030    pH 5.0  5.0 - 8.0    Glucose, UA NEGATIVE  NEGATIVE mg/dL    Hgb urine dipstick NEGATIVE  NEGATIVE    Bilirubin Urine NEGATIVE  NEGATIVE    Ketones, ur NEGATIVE  NEGATIVE mg/dL    Protein, ur NEGATIVE  NEGATIVE mg/dL    Urobilinogen, UA 0.2  0.0 - 1.0 mg/dL    Nitrite NEGATIVE  NEGATIVE  Leukocytes, UA NEGATIVE  NEGATIVE    Bacteria, UA RARE  RARE    Squamous Epithelial / LPF RARE  RARE   CBC WITH DIFFERENTIAL     Status: Abnormal   Collection Time   10/05/12 12:15 AM      Component Value Range Comment   WBC 4.7  4.0 - 10.5 K/uL    RBC 3.29 (*) 3.87 - 5.11 MIL/uL    Hemoglobin 9.9 (*) 12.0 - 15.0 g/dL    HCT 16.1 (*) 09.6 - 46.0 %    MCV 93.0  78.0 - 100.0 fL    MCH 30.1  26.0 - 34.0 pg    MCHC 32.4  30.0 - 36.0 g/dL    RDW 04.5  40.9 - 81.1 %    Platelets 162  150 - 400 K/uL    Neutrophils Relative 70  43 - 77 %    Neutro Abs 3.3  1.7 - 7.7 K/uL    Lymphocytes Relative 19  12 - 46 %    Lymphs Abs 0.9  0.7 - 4.0 K/uL    Monocytes Relative 6  3 - 12 %    Monocytes Absolute 0.3  0.1 - 1.0 K/uL     Eosinophils Relative 4  0 - 5 %    Eosinophils Absolute 0.2  0.0 - 0.7 K/uL    Basophils Relative 0  0 - 1 %    Basophils Absolute 0.0  0.0 - 0.1 K/uL   COMPREHENSIVE METABOLIC PANEL     Status: Abnormal   Collection Time   10/05/12 12:15 AM      Component Value Range Comment   Sodium 137  135 - 145 mEq/L    Potassium 4.2  3.5 - 5.1 mEq/L    Chloride 101  96 - 112 mEq/L    CO2 27  19 - 32 mEq/L    Glucose, Bld 102 (*) 70 - 99 mg/dL    BUN 22  6 - 23 mg/dL    Creatinine, Ser 9.14  0.50 - 1.10 mg/dL    Calcium 8.7  8.4 - 78.2 mg/dL    Total Protein 6.5  6.0 - 8.3 g/dL    Albumin 3.2 (*) 3.5 - 5.2 g/dL    AST 11  0 - 37 U/L    ALT 9  0 - 35 U/L    Alkaline Phosphatase 77  39 - 117 U/L    Total Bilirubin 0.3  0.3 - 1.2 mg/dL    GFR calc non Af Amer 53 (*) >90 mL/min    GFR calc Af Amer 62 (*) >90 mL/min   POCT I-STAT, CHEM 8     Status: Abnormal   Collection Time   10/05/12 12:32 AM      Component Value Range Comment   Sodium 139  135 - 145 mEq/L    Potassium 4.4  3.5 - 5.1 mEq/L    Chloride 102  96 - 112 mEq/L    BUN 29 (*) 6 - 23 mg/dL    Creatinine, Ser 9.56  0.50 - 1.10 mg/dL    Glucose, Bld 213 (*) 70 - 99 mg/dL    Calcium, Ion 0.86 (*) 1.13 - 1.30 mmol/L    TCO2 26  0 - 100 mmol/L    Hemoglobin 9.5 (*) 12.0 - 15.0 g/dL    HCT 57.8 (*) 46.9 - 46.0 %    US Transvaginal Non-ob  10/05/2012  *RADIOLOGY REPORT*  Clinical Data: Vaginal bleeding.  History of hysterectomy.  TRANSABDOMINAL AND TRANSVAGINAL ULTRASOUND OF PELVIS Technique:  Both transabdominal and transvaginal ultrasound examinations of the pelvis were performed. Transabdominal technique was performed for global imaging of the pelvis including uterus, ovaries, adnexal regions, and pelvic cul-de-sac.  It was necessary to proceed with endovaginal exam following the transabdominal exam to visualize the remaining pelvic structures in greater detail.  Comparison:  None  Findings:  Uterus: Status post hysterectomy.  No definite  abnormalities seen at the vaginal cuff.  Endometrium: N/A  Right ovary:  Status post right-sided salpingo-oophorectomy.  Left ovary: Status post left-sided salpingo-oophorectomy.  Other findings: No free fluid is seen within the pelvic cul-de-sac.  IMPRESSION: Unremarkable pelvic ultrasound, status post hysterectomy and bilateral salpingo-oophorectomy.   Original Report Authenticated By: Tonia Ghent, M.D.    US Pelvis Complete  10/05/2012  *RADIOLOGY REPORT*  Clinical Data: Vaginal bleeding.  History of hysterectomy.  TRANSABDOMINAL AND TRANSVAGINAL ULTRASOUND OF PELVIS Technique:  Both transabdominal and transvaginal ultrasound examinations of the pelvis were performed. Transabdominal technique was performed for global imaging of the pelvis including uterus, ovaries, adnexal regions, and pelvic cul-de-sac.  It was necessary to proceed with endovaginal exam following the transabdominal exam to visualize the remaining pelvic structures in greater detail.  Comparison:  None  Findings:  Uterus: Status post hysterectomy.  No definite abnormalities seen at the vaginal cuff.  Endometrium: N/A  Right ovary:  Status post right-sided salpingo-oophorectomy.  Left ovary: Status post left-sided salpingo-oophorectomy.  Other findings: No free fluid is seen within the pelvic cul-de-sac.  IMPRESSION: Unremarkable pelvic ultrasound, status post hysterectomy and bilateral salpingo-oophorectomy.   Original Report Authenticated By: Tonia Ghent, M.D.     Review of Systems  Constitutional: Negative.   HENT: Negative.   Eyes: Negative.   Respiratory: Negative.   Cardiovascular: Negative.   Gastrointestinal: Positive for abdominal pain.  Genitourinary:       Vaginal bleeding.  Musculoskeletal: Negative.   Skin: Negative.   Neurological: Negative.   Endo/Heme/Allergies: Negative.   Psychiatric/Behavioral: Negative.     Blood pressure 122/57, pulse 80, temperature 98.7 F (37.1 C), temperature source Oral, resp.  rate 20, SpO2 99.00%. Physical Exam  Constitutional: She is oriented to person, place, and time. She appears well-developed and well-nourished. No distress.  HENT:  Head: Normocephalic and atraumatic.  Right Ear: External ear normal.  Left Ear: External ear normal.  Mouth/Throat: Oropharynx is clear and moist. No oropharyngeal exudate.  Eyes: Conjunctivae normal are normal. Pupils are equal, round, and reactive to light. Right eye exhibits no discharge. Left eye exhibits no discharge. No scleral icterus.  Neck: Normal range of motion. Neck supple.  Cardiovascular: Normal rate and regular rhythm.   Respiratory: Effort normal and breath sounds normal. No respiratory distress. She has no wheezes. She has no rales.  GI: Soft. Bowel sounds are normal. She exhibits no distension. There is no tenderness. There is no rebound.  Musculoskeletal: She exhibits tenderness. She exhibits no edema.  Neurological: She is alert and oriented to person, place, and time.  Skin: Skin is warm and dry. She is not diaphoretic.       Petechial rash both lower extremities.  Psychiatric: Her behavior is normal.     Assessment/Plan #1. Abdominal pain with vaginal bleeding and lower extremity tender petechial rash - at this time the cause for the abdominal pain and the rash along with vaginal bleed is not known. Patient's platelet counts are normal. We have ordered sedimentation rate, LDH, INR and will check Dopplers of lower extremity. CT  abdomen and pelvis has been ordered. Closely follow CBC. Patient may need biopsy of the skin lesion. For now I am discontinuing her Augmentin. #2. Anemia - patient does have anemia but it has worsened this time. Check LDH for any hemolytic process. Check anemia panel. Patient's anemia may be worsened because of the vaginal bleed. Closely follow CBC. #3. History of uterine cancer status post hysterectomy.  CODE STATUS - full code.  Kayhan Boardley N. 10/05/2012, 3:05 AM

## 2012-10-05 NOTE — ED Notes (Signed)
Pt to be transported to the floor after CT scan.

## 2012-10-05 NOTE — Progress Notes (Addendum)
Paged Dr. Ardyth Harps that patient has a heart monitor from home that was ordered by Dr. Jens Som for enlarged pulmonary artery, enlarged heart, and heart murmur, does MD want patient on telemetry. Md has arrived on floor and will assess patient first.

## 2012-10-05 NOTE — Progress Notes (Signed)
*  PRELIMINARY RESULTS* Vascular Ultrasound Lower extremity venous duplex has been completed.  Preliminary findings: Bilateral:  No evidence of DVT, superficial thrombosis, or Baker's Cyst.    Farrel Demark, RDMS, RVT 10/05/2012, 9:45 AM

## 2012-10-06 LAB — COMPREHENSIVE METABOLIC PANEL
AST: 12 U/L (ref 0–37)
BUN: 15 mg/dL (ref 6–23)
CO2: 28 mEq/L (ref 19–32)
Chloride: 102 mEq/L (ref 96–112)
Creatinine, Ser: 1.04 mg/dL (ref 0.50–1.10)
GFR calc non Af Amer: 54 mL/min — ABNORMAL LOW (ref >90)
Total Bilirubin: 0.3 mg/dL (ref 0.3–1.2)

## 2012-10-06 LAB — CBC WITH DIFFERENTIAL/PLATELET
Basophils Absolute: 0 10*3/uL (ref 0.0–0.1)
HCT: 30.6 % — ABNORMAL LOW (ref 36.0–46.0)
Hemoglobin: 9.8 g/dL — ABNORMAL LOW (ref 12.0–15.0)
Lymphocytes Relative: 27 % (ref 12–46)
Monocytes Absolute: 0.2 10*3/uL (ref 0.1–1.0)
Monocytes Relative: 5 % (ref 3–12)
Neutro Abs: 2.8 10*3/uL (ref 1.7–7.7)
WBC: 4.4 10*3/uL (ref 4.0–10.5)

## 2012-10-06 MED ORDER — BACITRACIN-NEOMYCIN-POLYMYXIN 400-5-5000 EX OINT: TOPICAL_OINTMENT | Freq: Two times a day (BID) | CUTANEOUS | Status: DC

## 2012-10-06 MED ORDER — OXYCODONE-ACETAMINOPHEN 5-325 MG PO TABS: 1.0000 | ORAL_TABLET | Freq: Four times a day (QID) | ORAL | Status: DC | PRN

## 2012-10-06 NOTE — Discharge Summary (Signed)
Physician Discharge Summary  Patient ID: [name] MRN: [id number] DOB/AGE: 1946-01-21 66 y.o.  Admit date: 10/04/2012 Discharge date: 10/06/2012  Primary Care Physician:  Nani Gasser, MD   Discharge Diagnoses:    Principal Problem:  *Abdominal pain Active Problems:  Anemia  Vaginal bleeding  Petechial rash      Medication List     As of 10/06/2012  2:57 PM    STOP taking these medications         amoxicillin-clavulanate 875-125 MG per tablet   Commonly known as: AUGMENTIN      TAKE these medications         FLUoxetine 20 MG capsule   Commonly known as: PROZAC   Take 20 mg by mouth 3 (three) times daily.      Iron 240 (27 FE) MG Tabs   Take 1 tablet by mouth daily.      multivitamin capsule   Take 1 capsule by mouth daily.      neomycin-bacitracin-polymyxin ointment   Commonly known as: NEOSPORIN   Apply topically 2 (two) times daily. apply to eye      oxyCODONE-acetaminophen 5-325 MG per tablet   Commonly known as: PERCOCET/ROXICET   Take 1-2 tablets by mouth every 6 (six) hours as needed (use first for pain relief.).      pantoprazole 40 MG tablet   Commonly known as: PROTONIX   Take 1 tablet (40 mg total) by mouth daily.      senna-docusate 8.6-50 MG per tablet   Commonly known as: Senokot-S   Take 1 tablet by mouth daily as needed. For constipation      traZODone 50 MG tablet   Commonly known as: DESYREL   Take 50-150 mg by mouth at bedtime.      vitamin B-12 1000 MCG tablet   Commonly known as: CYANOCOBALAMIN   Take 1,000 mcg by mouth daily.      Vitamin D3 1000 UNITS Caps   Take 2,000 Units by mouth daily.         Disposition and Follow-up:  Will be discharged home today in stable and improved condition. Will follow up with her PCP in 2 weeks. If lower extremity rash still present, would recommend dermatology referral for skin biopsy.  Consults:  GYN/Oncology Warner Mccreedy, NP for Dr. Duard Brady.   Significant Diagnostic  Studies:  US Transvaginal Non-ob  10/05/2012  *RADIOLOGY REPORT*  Clinical Data: Vaginal bleeding.  History of hysterectomy.  TRANSABDOMINAL AND TRANSVAGINAL ULTRASOUND OF PELVIS Technique:  Both transabdominal and transvaginal ultrasound examinations of the pelvis were performed. Transabdominal technique was performed for global imaging of the pelvis including uterus, ovaries, adnexal regions, and pelvic cul-de-sac.  It was necessary to proceed with endovaginal exam following the transabdominal exam to visualize the remaining pelvic structures in greater detail.  Comparison:  None  Findings:  Uterus: Status post hysterectomy.  No definite abnormalities seen at the vaginal cuff.  Endometrium: N/A  Right ovary:  Status post right-sided salpingo-oophorectomy.  Left ovary: Status post left-sided salpingo-oophorectomy.  Other findings: No free fluid is seen within the pelvic cul-de-sac.  IMPRESSION: Unremarkable pelvic ultrasound, status post hysterectomy and bilateral salpingo-oophorectomy.   Original Report Authenticated By: Tonia Ghent, M.D.    US Pelvis Complete  10/05/2012  *RADIOLOGY REPORT*  Clinical Data: Vaginal bleeding.  History of hysterectomy.  TRANSABDOMINAL AND TRANSVAGINAL ULTRASOUND OF PELVIS Technique:  Both transabdominal and transvaginal ultrasound examinations of the pelvis were performed. Transabdominal technique was performed for global imaging of the  pelvis including uterus, ovaries, adnexal regions, and pelvic cul-de-sac.  It was necessary to proceed with endovaginal exam following the transabdominal exam to visualize the remaining pelvic structures in greater detail.  Comparison:  None  Findings:  Uterus: Status post hysterectomy.  No definite abnormalities seen at the vaginal cuff.  Endometrium: N/A  Right ovary:  Status post right-sided salpingo-oophorectomy.  Left ovary: Status post left-sided salpingo-oophorectomy.  Other findings: No free fluid is seen within the pelvic cul-de-sac.   IMPRESSION: Unremarkable pelvic ultrasound, status post hysterectomy and bilateral salpingo-oophorectomy.   Original Report Authenticated By: Tonia Ghent, M.D.    Ct Abdomen Pelvis W Contrast  10/05/2012  *RADIOLOGY REPORT*  Clinical Data: Lower abdominal pain and vaginal bleeding.  CT ABDOMEN AND PELVIS WITH CONTRAST  Technique:  Multidetector CT imaging of the abdomen and pelvis was performed following the standard protocol during bolus administration of intravenous contrast.  Contrast: OMNIPAQUE IOHEXOL 300 MG/ML  SOLN  Comparison: CT of the abdomen and pelvis performed 09/04/2012  Findings: Minimal bibasilar atelectasis is noted.  The liver and spleen are unremarkable in appearance.  A stone is noted within the gallbladder; the gallbladder is otherwise unremarkable in appearance.  The pancreas and adrenal glands are unremarkable.  Mild nonspecific perinephric stranding is noted.  There is an exophytic 3.0 cm cyst arising at the lower pole of the right kidney.  The kidneys are otherwise unremarkable in appearance. There is no evidence of hydronephrosis.  No renal or ureteral stones are seen.  No free fluid is identified.  The small bowel is unremarkable in appearance.  The stomach is within normal limits.  No acute vascular abnormalities are seen.  Mild scattered calcification is noted along the abdominal aorta.  The appendix is very diminutive in appearance, without evidence for appendicitis.  Contrast progresses to the level of the descending colon.  Minimal diverticulosis is noted along the distal descending colon, and scattered diverticulosis is seen along the proximal sigmoid colon, without evidence of diverticulitis.  The bladder is moderately distended and grossly unremarkable in appearance.  The patient is status post hysterectomy.  No suspicious adnexal masses are seen.  No inguinal lymphadenopathy is seen.  No acute osseous abnormalities are identified.  IMPRESSION:  1.  No acute abnormality  seen within the abdomen or pelvis. 2.  Scattered diverticulosis noted along the sigmoid and distal descending colon, without evidence of diverticulitis. 3.  Exophytic right renal cyst again seen.   Original Report Authenticated By: Tonia Ghent, M.D.     Brief H and P: For complete details please refer to admission H and P, but in brief patient is a 66 year-old female with history of uterine cancer status post abdominal hysterectomy and radiation 2 years ago presents with complaints of abdominal pain and vaginal bleeding since yesterday afternoon. Patient started developing some bilateral flank pain after lifting some weight. She initially thought it pain was due to sprain due to lifting weights. But the pain persisted and after that and she went to the bathroom she had some vaginal bleed. Since the pain was persistent she came to the ER. In the ER pelvic ultrasound was unremarkable. Patient while in the ER on exam was found to have bilateral lower extremity petechial rash which was tender to touch. The petechial rash involved the area where she wore the stockings. Patient denies any fever chills or any trauma. The area was tender to touch and it is never happened before. The only new medication patient is taking his  Augmentin for sinus infection 10 days ago and today she was completing the course of antibiotics. Patient has no rash anywhere else on the body. Denies any rectal bleeding area the pain in the abdomen improved with pain relief medications and presently is nontender. Denies any chest pain or shortness of breath or any hemoptysis. Patient is having good mobility of her lower extremities. We were asked to admit her for further evaluation and management.      Hospital Course:  Principal Problem:  *Abdominal pain Active Problems:  Anemia  Vaginal bleeding  Petechial rash   Vaginal Bleeding  -In a patient with a h/o endometrial cancer s/p hysterectomy.  -CT scan and transvaginal US do  not show evidence for recurrence of disease.  -Has not had any further bleeding.  -Was seen by GYN-Oncology with findings of a "sore" on her right labia. This is the presumed source of bleeding.  -No treatment recommended other than good hygiene and triple-antibiotic cream.  -Appreciate GYN-ONC assistance and recommendations.   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  -Has been stable.  -No indication for transfusion.   Petechial-looking Rash on Bilateral Lower Extremities  -Etiology unclear at this point.  -Patient states it appeared all of a sudden the day of admission.  -Also states it is very painful to touch; however during exam if I would not tell her I was touching her legs she did not have pain. If I told her I was going to examine her legs she wouldn't even let me brush her skin because of "severe pain and burning".  -She does not have thrombocytopenia. ESR is only mildly elevated.  -I have explained to patient that this does not appear concerning or life-threatening.  -Very difficult to get inpatient dermatology consultation. -Plan will be for her to see a dermatologist OP to consider a skin biopsy if this persists more than 10-14 days.  Rest of chronic medical issues have been stable.    Time spent on Discharge: Greater than 30 minutes,  Signed: Chaya Jan [facility] Pager: [phone] 10/06/2012, 2:57 PM

## 2012-10-07 MED FILL — Neomycin-Bacitracin-Polymyxin Oint: CUTANEOUS | Qty: 1 | Status: AC

## 2012-10-08 ENCOUNTER — Telehealth: Payer: Self-pay | Admitting: Gynecologic Oncology

## 2012-10-08 NOTE — Telephone Encounter (Signed)
Spoke with the patient about current status since discharge from the hospital.  Pt stating that her "legs are still sore" but that "they look better and are not as red."  Reporting that the minimal amount of drainage from the small sore found beside her right labia has resolved.  Using neosporin to the area twice daily.  Pt coughing and sounding hoarse during the conversation.  Instructed to follow up with her primary care physician sooner that Nov 19 th if the leg soreness or upper respiratory infection did not improve.  Instructed to call for any concerns.  Appointment for Nov. 14 th cancelled due to the absence of vaginal bleeding and the normal speculum exam during her hospitalization.  Instructed to call the office if the area to the right of her right labia began draining again or became sore.

## 2012-10-08 NOTE — Care Management Note (Signed)
    Page 1 of 1   10/08/2012     9:04:33 AM   CARE MANAGEMENT NOTE 10/08/2012  Patient:  [name], [name]   Account Number:  [id number]  Date Initiated:  10/08/2012  Documentation initiated by:  Lorenda Ishihara  Subjective/Objective Assessment:     Action/Plan:   Anticipated DC Date:  10/06/2012   Anticipated DC Plan:  HOME/SELF CARE         Choice offered to / List presented to:             Status of service:  Completed, signed off Medicare Important Message given?   (If response is "NO", the following Medicare IM given date fields will be blank) Date Medicare IM given:   Date Additional Medicare IM given:    Discharge Disposition:  HOME/SELF CARE  Per UR Regulation:  Reviewed for med. necessity/level of care/duration of stay  If discussed at Long Length of Stay Meetings, dates discussed:    Comments:

## 2012-10-09 LAB — FOLATE: Folate: >24 ng/mL (ref >5.4)

## 2012-10-10 ENCOUNTER — Telehealth: Payer: Self-pay | Admitting: *Deleted

## 2012-10-10 NOTE — Telephone Encounter (Signed)
Spoke with pt, .aware of results.  

## 2012-10-10 NOTE — Telephone Encounter (Signed)
Left message for pt to call, monitor reviewed by dr crenshaw shows sinus 

## 2012-10-11 ENCOUNTER — Ambulatory Visit: Payer: Medicare PPO | Admitting: Gynecologic Oncology

## 2012-10-16 ENCOUNTER — Ambulatory Visit (INDEPENDENT_AMBULATORY_CARE_PROVIDER_SITE_OTHER): Payer: Medicare PPO | Admitting: Family Medicine

## 2012-10-16 ENCOUNTER — Encounter: Payer: Self-pay | Admitting: Family Medicine

## 2012-10-16 VITALS — BP 132/71 | HR 88 | Ht 64.5 in | Wt 241.0 lb

## 2012-10-16 DIAGNOSIS — D649 Anemia, unspecified: Secondary | ICD-10-CM

## 2012-10-16 DIAGNOSIS — N949 Unspecified condition associated with female genital organs and menstrual cycle: Secondary | ICD-10-CM

## 2012-10-16 DIAGNOSIS — R233 Spontaneous ecchymoses: Secondary | ICD-10-CM

## 2012-10-16 DIAGNOSIS — R102 Pelvic and perineal pain: Secondary | ICD-10-CM

## 2012-10-16 LAB — CBC WITH DIFFERENTIAL/PLATELET
Basophils Absolute: 0 10*3/uL (ref 0.0–0.1)
Basophils Relative: 0 % (ref 0–1)
Eosinophils Absolute: 0.2 10*3/uL (ref 0.0–0.7)
Lymphs Abs: 1.3 10*3/uL (ref 0.7–4.0)
MCH: 30 pg (ref 26.0–34.0)
Neutrophils Relative %: 65 % (ref 43–77)
Platelets: 234 10*3/uL (ref 150–400)
RBC: 4.04 MIL/uL (ref 3.87–5.11)
RDW: 15.7 % — ABNORMAL HIGH (ref 11.5–15.5)

## 2012-10-16 MED ORDER — OXYCODONE-ACETAMINOPHEN 5-325 MG PO TABS: 1.0000 | ORAL_TABLET | Freq: Four times a day (QID) | ORAL | Status: DC | PRN

## 2012-10-16 NOTE — Progress Notes (Signed)
  Subjective:    Patient ID: [name], female    DOB: February 08, 1946, 66 y.o.   MRN: [id number]  HPI [name] to the ED for vaginal bleeding.  Had a sore/boil on the out edge of vagina that was bleeding.her BP was low and she was dehydrated. Given IV fluids. Given IV morphine for pelvis pain.  She also had petechia and swelling on her lower legs from Knees to her feet.  Was told to rest for 14 days and has been elvated her feet and says her legs look much better.  She also had just finished a course of Augmentin for sinusitis.  Has appt with Dr. Anne Hahn for 2 weeks. Says her weight is down from hospital discharge.  Mouth is still sore.  Says was also taking Aleve BID for her arthritis and she wonders if this could have caused her swelling and petechia.   Review of Systems     Objective:   Physical Exam  Constitutional: She is oriented to person, place, and time. She appears well-developed and well-nourished.  HENT:  Head: Normocephalic and atraumatic.  Cardiovascular: Normal rate, regular rhythm and normal heart sounds.   Pulmonary/Chest: Effort normal and breath sounds normal.  Neurological: She is alert and oriented to person, place, and time.  Skin: Skin is warm and dry.  Psychiatric: She has a normal mood and affect. Her behavior is normal.          Assessment & Plan:  Vaginal bleeding is resolved.  She is doing better.    Petechia and LE swellling - May have been secondary to Aleve. Hold off on aleve for now.  Myabe once feeling better cna retry it.  Certainly petechiae and lower extremity swelling as a potential side effects of Aleve. She only uses the trazodone occasionally and this is her only new medication. It may have been the perfect storm of being sick with a sinus infection, also being dehydrated and taking the Aleve that may have triggered this. Otherwise she tolerated really well for years and in fact she says this works the best for her arthritis in any other medication she's  ever tried. We will recheck a CBC today to make sure that her hemoglobin is steady.  Anemia-she did have a drop in her hemoglobin in October from 11 down to 9.8. Though I do think that she was dehydrated and hemoconcentrated when she had a lab test in October. That is when I saw her for an acute sinusitis and she had a very dry mouth and looked dry on exam that day. So the drop may not be that significant. But we will certainly recheck it today to make sure that it's at least stable

## 2012-11-01 ENCOUNTER — Telehealth: Payer: Self-pay | Admitting: *Deleted

## 2012-11-01 NOTE — Telephone Encounter (Signed)
CALLED PATIENT TO INFORM OF CHANGE TO FU VISIT , SPOKE WITH PATIENT AND SHE IS AWARE OF THE CHANGE.

## 2012-11-01 NOTE — Telephone Encounter (Signed)
CALLED PATIENT TO INFORM THAT FU VISIT HAS BEEN CHANGED FROM 11-08-12  TO 11-22-12, PATIENT IS AWARE OF THE APPT. AND SHE REQUESTED 11-22-12.

## 2012-11-05 ENCOUNTER — Ambulatory Visit: Payer: Medicare PPO | Admitting: Radiation Oncology

## 2012-11-07 ENCOUNTER — Encounter: Payer: Self-pay | Admitting: Cardiology

## 2012-11-07 ENCOUNTER — Ambulatory Visit (INDEPENDENT_AMBULATORY_CARE_PROVIDER_SITE_OTHER): Payer: Medicare PPO | Admitting: Cardiology

## 2012-11-07 VITALS — BP 117/75 | HR 77 | Wt 241.0 lb

## 2012-11-07 DIAGNOSIS — I350 Nonrheumatic aortic (valve) stenosis: Secondary | ICD-10-CM

## 2012-11-07 DIAGNOSIS — R0609 Other forms of dyspnea: Secondary | ICD-10-CM

## 2012-11-07 DIAGNOSIS — I288 Other diseases of pulmonary vessels: Secondary | ICD-10-CM

## 2012-11-07 DIAGNOSIS — R55 Syncope and collapse: Secondary | ICD-10-CM

## 2012-11-07 DIAGNOSIS — R0600 Dyspnea, unspecified: Secondary | ICD-10-CM

## 2012-11-07 DIAGNOSIS — I359 Nonrheumatic aortic valve disorder, unspecified: Secondary | ICD-10-CM

## 2012-11-07 NOTE — Patient Instructions (Addendum)
Your physician wants you to follow-up in: ONE YEAR WITH DR CRENSHAW IN [place] You will receive a reminder letter in the mail two months in advance. If you don't receive a letter, please call our office to schedule the follow-up appointment.  

## 2012-11-07 NOTE — Assessment & Plan Note (Signed)
Plan repeat echocardiogram when she returns in one year. Mean gradient 16.

## 2012-11-07 NOTE — Assessment & Plan Note (Signed)
Echocardiogram does not show right ventricular enlargement and tricuspid velocity was 2.5 m/s. No plans for further evaluation. She may have very mild pulmonary hypertension from obesity hypoventilation syndrome.

## 2012-11-07 NOTE — Progress Notes (Signed)
HPI: Plesant female I initially saw in Oct 2013 for evaluation of cardiac enlargement and pulmonary artery enlargement. The patient has a history of uterine cancer. She recently had a CT scan of her abdomen and chest. The CT of her chest showed no metastatic disease. There was note of cardiomegaly and pulmonary artery enlargement suggestive of pulmonary hypertension. Laboratories in September of 2013 showed normal renal function, normal liver functions, normocytic anemia with a hemoglobin of 10.1. Echocardiogram in October of 2013 showed an ejection fraction of 65-70%, grade 2 diastolic dysfunction, mild biatrial enlargement and mild aortic stenosis. TR velocity was 2.5 m/s. Lower extremity Dopplers in November of 2013 showed no DVT. CardioNet performed for near syncope showed sinus rhythm. Since I last saw her she has dyspnea with more extreme activities but not routine activities. No orthopnea, PND, pedal edema, exertional chest pain or syncope.   Current Outpatient Prescriptions  Medication Sig Dispense Refill  . Cholecalciferol (VITAMIN D3) 1000 UNITS CAPS Take 2,000 Units by mouth daily.        . Ferrous Gluconate (IRON) 240 (27 FE) MG TABS Take 1 tablet by mouth daily.      Marland Kitchen FLUoxetine (PROZAC) 20 MG capsule Take 20 mg by mouth 3 (three) times daily.      . Multiple Vitamin (MULTIVITAMIN) capsule Take 1 capsule by mouth daily.        Marland Kitchen oxyCODONE-acetaminophen (PERCOCET/ROXICET) 5-325 MG per tablet Take 1-2 tablets by mouth every 6 (six) hours as needed (use first for pain relief.).  30 tablet  0  . pantoprazole (PROTONIX) 40 MG tablet Take 1 tablet (40 mg total) by mouth daily.  30 tablet  6  . senna-docusate (SENOKOT-S) 8.6-50 MG per tablet Take 1 tablet by mouth daily as needed. For constipation      . traZODone (DESYREL) 50 MG tablet Take 50-150 mg by mouth at bedtime.      . vitamin B-12 (CYANOCOBALAMIN) 1000 MCG tablet Take 1,000 mcg by mouth daily.           Past Medical History    Diagnosis Date  . Back pain   . Diverticulosis April 2012  . Depression   . Uterine cancer 2011    endometrialca  . Hx of radiation therapy 03/08/11-04/18/11& 5/29,6/04/03/11/12    external beam and intracavity brachytherapy  . Anxiety   . Arthritis     degenerative  . Obesity   . Anemia     multifactorial,r/t chemo and radiation also  . History of mammogram 06/14/11    b/l mammogram    Past Surgical History  Procedure Date  . Portacath placement Jan 2012  . Abdominal hysterectomy 2011    History   Social History  . Marital Status: Married    Spouse Name: N/A    Number of Children: 1  . 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 Smoking status: Former Games developer  . Smokeless tobacco: Never Used  . Alcohol Use: No     Comment: Former alcoholic  . Drug Use: No  . Sexually Active: Not on file   Other Topics Concern  . Not on file   Social History Narrative  . No narrative on file    ROS: no fevers or chills, productive cough, hemoptysis, dysphasia, odynophagia, melena, hematochezia, dysuria, hematuria, rash, seizure activity, orthopnea, PND, pedal edema, claudication. Remaining systems are negative.  Physical Exam: Well-developed obese in  no acute distress.  Skin is warm and dry.  HEENT is normal.  Neck is supple.  Chest is clear to auscultation with normal expansion.  Cardiovascular exam is regular rate and rhythm. 2/6 systolic murmur LSB Abdominal exam nontender or distended. No masses palpated. Extremities show no edema. neuro grossly intact

## 2012-11-07 NOTE — Assessment & Plan Note (Signed)
CardioNet shows no significant arrhythmia. No plans for further workup.

## 2012-11-07 NOTE — Assessment & Plan Note (Signed)
Possible contribution from obesity. Echocardiogram shows normal LV function and upper normal to mildly elevated pulmonary pressures. No plans for further evaluation at this point.

## 2012-11-08 ENCOUNTER — Ambulatory Visit: Payer: Medicare PPO | Admitting: Radiation Oncology

## 2012-11-20 DIAGNOSIS — E669 Obesity, unspecified: Secondary | ICD-10-CM | POA: Insufficient documentation

## 2012-11-20 DIAGNOSIS — F419 Anxiety disorder, unspecified: Secondary | ICD-10-CM | POA: Insufficient documentation

## 2012-11-20 DIAGNOSIS — M1990 Unspecified osteoarthritis, unspecified site: Secondary | ICD-10-CM | POA: Insufficient documentation

## 2012-11-20 DIAGNOSIS — M549 Dorsalgia, unspecified: Secondary | ICD-10-CM | POA: Insufficient documentation

## 2012-11-20 DIAGNOSIS — F329 Major depressive disorder, single episode, unspecified: Secondary | ICD-10-CM | POA: Insufficient documentation

## 2012-11-22 ENCOUNTER — Ambulatory Visit
Admission: RE | Admit: 2012-11-22 | Discharge: 2012-11-22 | Disposition: A | Discharge status: Home / Self Care | Payer: Medicare PPO | Source: Ambulatory Visit | Attending: Radiation Oncology | Admitting: Radiation Oncology

## 2012-11-22 ENCOUNTER — Encounter: Payer: Self-pay | Admitting: Radiation Oncology

## 2012-11-22 ENCOUNTER — Other Ambulatory Visit (HOSPITAL_COMMUNITY)
Admission: RE | Admit: 2012-11-22 | Discharge: 2012-11-22 | Disposition: A | Discharge status: Home / Self Care | Payer: Medicare PPO | Source: Ambulatory Visit | Attending: Radiation Oncology | Admitting: Radiation Oncology

## 2012-11-22 VITALS — BP 125/67 | HR 75 | Temp 98.3°F | Wt 247.5 lb

## 2012-11-22 DIAGNOSIS — Z124 Encounter for screening for malignant neoplasm of cervix: Secondary | ICD-10-CM | POA: Insufficient documentation

## 2012-11-22 DIAGNOSIS — C541 Malignant neoplasm of endometrium: Secondary | ICD-10-CM

## 2012-11-22 NOTE — Progress Notes (Signed)
Patient here for routine follow up endometrial radiation completed June of 2012.Doing fine now.Hospitalized prior to Thanksgiving secondary to bleeding of boil on vaginal lips.

## 2012-11-22 NOTE — Progress Notes (Signed)
Radiation Oncology         [phone] ________________________________  Name: [name] MRN: [id number]  Date: 11/22/2012  DOB: 06/08/1946  Follow-Up Visit Note  CC: METHENEY,CATHERINE, MD  Reece Packer, MD  Diagnosis:   Endometrioid adenocarcinoma  Interval Since Last Radiation:  One and a half years   Narrative:  The patient returns today for routine follow-up.  She clinically seems to be doing well. Last month patient did present with some bleeding from the vulvar area. She  underwent  a thorough workup for this issue. She was felt to have sebaceous cyst which had ruptured.   No evidence recurrence of her endometrial cancer.  Since that admission she denies any vaginal bleeding or rectal bleeding or hematuria. She denies any pain in the pelvis area.                           ALLERGIES:   has no known allergies.  Meds: Current Outpatient Prescriptions  Medication Sig Dispense Refill  . Cholecalciferol (VITAMIN D3) 1000 UNITS CAPS Take 2,000 Units by mouth daily.        . Ferrous Gluconate (IRON) 240 (27 FE) MG TABS Take 1 tablet by mouth daily.      Marland Kitchen FLUoxetine (PROZAC) 20 MG capsule Take 20 mg by mouth 3 (three) times daily.      . Multiple Vitamin (MULTIVITAMIN) capsule Take 1 capsule by mouth daily.        Marland Kitchen oxyCODONE-acetaminophen (PERCOCET/ROXICET) 5-325 MG per tablet Take 1-2 tablets by mouth every 6 (six) hours as needed (use first for pain relief.).  30 tablet  0  . pantoprazole (PROTONIX) 40 MG tablet Take 1 tablet (40 mg total) by mouth daily.  30 tablet  6  . senna-docusate (SENOKOT-S) 8.6-50 MG per tablet Take 1 tablet by mouth daily as needed. For constipation      . traZODone (DESYREL) 50 MG tablet Take 50-150 mg by mouth at bedtime.      . vitamin B-12 (CYANOCOBALAMIN) 1000 MCG tablet Take 1,000 mcg by mouth daily.          Physical Findings: The patient is in no acute distress. Patient is alert and oriented.  weight is 247 lb 8 oz (112.265 kg). Her  temperature is 98.3 F (36.8 C). Her blood pressure is 125/67 and her pulse is 75. Her oxygen saturation is 96%. .  No palpable supraclavicular or axillary adenopathy. Lungs are clear to auscultation. The heart has a regular rhythm and rate.  The abdomen is soft and nontender with normal bowel sounds. The inguinal areas are free of adenopathy. On pelvic examination the external genitalia are unremarkable.  A speculum exam is performed which reveals no mucosal lesions within the vaginal vault. A Pap smear was obtained of the proximal vagina.  There are some radiation changes noted in the proximal vagina.  On bimanual and rectovaginal examination there no pelvic masses appreciated.  Lab Findings: Lab Results  Component Value Date   WBC 5.2 10/16/2012   HGB 12.1 10/16/2012   HCT 36.6 10/16/2012   MCV 90.6 10/16/2012   PLT 234 10/16/2012      Radiographic Findings: No results found.  Impression:  No evidence recurrence on clinical exam today, Pap smear pending  Plan:  Routine followup in 8 months. The patient will be seen in 4 months by Dr. Duard Brady  _____________________________________  -----------------------------------  Billie Lade, PhD, MD

## 2012-11-26 ENCOUNTER — Ambulatory Visit: Payer: Medicare PPO | Admitting: Family Medicine

## 2012-11-26 DIAGNOSIS — Z0289 Encounter for other administrative examinations: Secondary | ICD-10-CM

## 2012-11-27 ENCOUNTER — Telehealth: Payer: Self-pay

## 2012-11-27 NOTE — Telephone Encounter (Addendum)
Patient very delighted with results of pap smear performed on 11/22/12  negative for malignancy.

## 2013-02-07 ENCOUNTER — Encounter: Payer: Self-pay | Admitting: Family Medicine

## 2013-02-07 ENCOUNTER — Ambulatory Visit: Payer: Medicare PPO | Admitting: Family Medicine

## 2013-02-07 ENCOUNTER — Ambulatory Visit (INDEPENDENT_AMBULATORY_CARE_PROVIDER_SITE_OTHER): Payer: Medicare PPO | Admitting: Family Medicine

## 2013-02-07 VITALS — BP 127/73 | HR 81 | Temp 98.4°F | Ht 65.0 in | Wt 249.0 lb

## 2013-02-07 DIAGNOSIS — R509 Fever, unspecified: Secondary | ICD-10-CM

## 2013-02-07 DIAGNOSIS — R197 Diarrhea, unspecified: Secondary | ICD-10-CM

## 2013-02-07 DIAGNOSIS — R112 Nausea with vomiting, unspecified: Secondary | ICD-10-CM

## 2013-02-07 LAB — CBC WITH DIFFERENTIAL/PLATELET
Basophils Absolute: 0 10*3/uL (ref 0.0–0.1)
Basophils Relative: 0 % (ref 0–1)
Lymphocytes Relative: 24 % (ref 12–46)
Neutro Abs: 3.2 10*3/uL (ref 1.7–7.7)
Neutrophils Relative %: 65 % (ref 43–77)
Platelets: 193 10*3/uL (ref 150–400)
RDW: 16 % — ABNORMAL HIGH (ref 11.5–15.5)
WBC: 4.9 10*3/uL (ref 4.0–10.5)

## 2013-02-07 LAB — COMPLETE METABOLIC PANEL WITH GFR
ALT: 12 U/L (ref 0–35)
AST: 12 U/L (ref 0–37)
Albumin: 3.7 g/dL (ref 3.5–5.2)
Alkaline Phosphatase: 79 U/L (ref 39–117)
Calcium: 8.5 mg/dL (ref 8.4–10.5)
Chloride: 106 mEq/L (ref 96–112)
Potassium: 3.7 mEq/L (ref 3.5–5.3)

## 2013-02-07 MED ORDER — PROMETHAZINE HCL 25 MG PO TABS: 25.0000 mg | ORAL_TABLET | Freq: Four times a day (QID) | ORAL | Status: DC | PRN

## 2013-02-07 NOTE — Progress Notes (Signed)
  Subjective:    Patient ID: [name], female    DOB: 05-17-1946, 67 y.o.   MRN: [id number]  HPI Had a fever on Saturday and Sunday over the weekend but not over the last couple of days. She says she just feels nauseated hasn't been able to keep much on her stomach. She doesn't have any significant abdominal pain when she typically does with her diverticulitis. She is under a lot of stress right night.  No URI sxs.  Has had nausea, vomiting, diarrhea.  Vomiting is better but still having diarrhea. Says when eats feels like the food is stopping in her upper esophagus and then vomited. Says hering her stomach rumbling.  No blood in her stool.  Diarrhea is watery.  No recent travel or camping. Has been spending alto fo time with her husband.    Review of Systems     Objective:   Physical Exam  Constitutional: She is oriented to person, place, and time. She appears well-developed and well-nourished.  HENT:  Head: Normocephalic and atraumatic.  Right Ear: External ear normal.  Left Ear: External ear normal.  Nose: Nose normal.  Mouth/Throat: Oropharynx is clear and moist.  TMs and canals are clear.   Eyes: Conjunctivae and EOM are normal. Pupils are equal, round, and reactive to light.  Neck: Neck supple. No thyromegaly present.  Cardiovascular: Normal rate, regular rhythm and normal heart sounds.   Pulmonary/Chest: Effort normal and breath sounds normal. She has no wheezes.  Abdominal: Soft. Bowel sounds are normal. She exhibits no distension and no mass. There is tenderness. There is no rebound and no guarding.  Tender diffusely.   Musculoskeletal: She exhibits no edema.  Lymphadenopathy:    She has no cervical adenopathy.  Neurological: She is alert and oriented to person, place, and time.  Skin: Skin is warm and dry.  Psychiatric: She has a normal mood and affect.          Assessment & Plan:  Diarrhea - this could just be viral especially since it started with vomiting and  low-grade fever. Vomiting and fever have resolved she still has persistent watery diarrhea. I'm also concerned that she could have Clostridium difficile because she has been visiting the hospital and nursing home were her husband is. She has not been on any antibiotics in the last 30-60 days. We'll also check stool cultures she could have had some contaminated food to evaluate for Salmonella, Shigella and Escherichia coli. We'll call the results once available. She spikes another fever or suddenly gets abdominal pain, or her CBC shows an elevated white count today that is significant and consider further evaluation with CT abdomen and pelvis with contrast. For now given prescription for Phenergan for nausea. Make sure try to keep down fluids. Recommend Supartz etc. for nutrition.

## 2013-02-07 NOTE — Patient Instructions (Addendum)
We will call you with your lab results. If you don't here from us in about a week then please give us a call at [phone].  

## 2013-02-09 LAB — CLOSTRIDIUM DIFFICILE EIA: CDIFTX: NEGATIVE

## 2013-02-11 LAB — CLOSTRIDIUM DIFFICILE BY PCR: Toxigenic C. Difficile by PCR: NOT DETECTED

## 2013-03-05 ENCOUNTER — Ambulatory Visit (INDEPENDENT_AMBULATORY_CARE_PROVIDER_SITE_OTHER): Payer: Medicare PPO | Admitting: Family Medicine

## 2013-03-05 ENCOUNTER — Encounter: Payer: Self-pay | Admitting: Family Medicine

## 2013-03-05 VITALS — BP 103/64 | HR 79 | Temp 98.4°F | Wt 253.0 lb

## 2013-03-05 DIAGNOSIS — A499 Bacterial infection, unspecified: Secondary | ICD-10-CM

## 2013-03-05 DIAGNOSIS — J329 Chronic sinusitis, unspecified: Secondary | ICD-10-CM

## 2013-03-05 DIAGNOSIS — B9689 Other specified bacterial agents as the cause of diseases classified elsewhere: Secondary | ICD-10-CM

## 2013-03-05 DIAGNOSIS — Z09 Encounter for follow-up examination after completed treatment for conditions other than malignant neoplasm: Secondary | ICD-10-CM

## 2013-03-05 NOTE — Progress Notes (Signed)
CC: [name] is a 67 y.o. female is here for ER f/u   Subjective: HPI:  Followup ED visit for sinusitis on Sunday. Presented to [facility] [place] on Sunday afternoon for dizziness and upper respiratory congestion. Symptoms are moderate in severity and not responding to unspecified over-the-counter medications.  Symptoms began on Thursday and on Sunday were accompanied by moderate fatigue.  Symptoms were worsening on a daily basis, not interfering with sleep. Slightly improved with chicken broth. Not responding to rest. Dizziness described as lightheadedness when going from seated to standing position.  She informs me that she was told all blood work performed was normal, diagnosed with bacterial sinusitis started on Levaquin, Zofran, Antivert, hydrocodone. She's only been taking the Levaquin as she reports significant improvement in dizziness.  Today complains of facial pressure, white nasal congestion, sensation of postnasal drip, and cough all of which are mild to moderate in severity. Worse first thing in the morning or late at night. She feels improved overall but still somewhat fatigued. Fatigue is improving with sleeping most of the day and night for the past 48 hours. Facial pressure improving, dizziness improving. She denies fevers, chills, chest pain, shortness of breath, motor sensory disturbances, falls, swollen lymph nodes, ocular complaints, hearing loss, ear pressure, nausea, vomiting, abdominal pain.    Review Of Systems Outlined In HPI  Past Medical History  Diagnosis Date  . Back pain   . Diverticulosis April 2012  . Depression   . Uterine cancer 2011    endometrialca  . Hx of radiation therapy 03/08/11-04/18/11& 5/29,6/04/03/11/12    external beam and intracavity brachytherapy  . Anxiety   . Arthritis     degenerative  . Obesity   . Anemia     multifactorial,r/t chemo and radiation also  . History of mammogram 06/14/11    b/l mammogram     Family  History  Problem Relation Age of Onset  . Lung cancer Mother   . Pulmonary fibrosis Mother   . Heart attack Father   . Diabetes Father   . Colon cancer Neg Hx   . Rectal cancer Neg Hx   . Stomach cancer Neg Hx      History  Substance Use Topics  . Smoking status: Former Games developer  . Smokeless tobacco: Never Used  . Alcohol Use: No     Comment: Former alcoholic     Objective: Filed Vitals:   03/05/13 0834  BP: 103/64  Pulse: 79  Temp: 98.4 F (36.9 C)    General: Alert and Oriented, No Acute Distress HEENT: Pupils equal, round, reactive to light. Conjunctivae clear.  External ears unremarkable, canals clear with intact TMs with appropriate landmarks.  Middle ear appears open without effusion. Boggy inferior turbinates with mild mucoid discharge.  Moist mucous membranes, pharynx without inflammation nor lesions.  Neck supple without palpable lymphadenopathy nor abnormal masses. Lungs: Clear to auscultation bilaterally, no wheezing/ronchi/rales.  Comfortable work of breathing. Good air movement. Cardiac: Regular rate and rhythm. Normal S1/S2.  No murmurs, rubs, nor gallops.   Cranial nerves II through XII grossly intact Extremities: No peripheral edema.  Strong peripheral pulses.  Mental Status: No depression, anxiety, nor agitation. Skin: Warm and dry.  Assessment & Plan: [name] was seen today for er f/u.  Diagnoses and associated orders for this visit:  Bacterial sinusitis  Hospital discharge follow-up  Other Orders - HYDROcodone-acetaminophen (NORCO) 5-325 MG per tablet; Take 1 tablet by mouth every 6 (six) hours as needed for pain. -  meclizine (ANTIVERT) 25 MG tablet; Take 25 mg by mouth every 8 (eight) hours. - levofloxacin (LEVAQUIN) 500 MG tablet; Take 500 mg by mouth daily.    Bacterial sinusitis: Improving, reassured patient with reassuring exam. Records have been requested and will be reviewed as soon as available patient will be contacted if something was not  not communicated to her that is of significance. Continue Levaquin, will hold off on nasal steroid given improvement since starting Levaquin.Signs and symptoms requring emergent/urgent reevaluation were discussed with the patient. Encouraged patient to moderately increase salty foods and beverages over the next 2-3 days to help with blood pressure and dizziness.  25 minutes spent face-to-face during visit today of which at least 50% was counseling or coordinating care regarding bacterial sinusitis, ED followup.   Return if symptoms worsen or fail to improve.

## 2013-03-05 NOTE — Progress Notes (Signed)
Outside records obtained and reviewed, CT scan of the head significant only for right maxillary, ethmoid, and frontal sinus disease. Metabolic panel significant only for glucose 106. No white count, hemoglobin 11.3, platelets 181.

## 2013-03-07 ENCOUNTER — Telehealth: Payer: Self-pay | Admitting: Gynecologic Oncology

## 2013-03-07 ENCOUNTER — Ambulatory Visit: Payer: Medicare PPO | Attending: Gynecologic Oncology | Admitting: Gynecologic Oncology

## 2013-03-07 ENCOUNTER — Encounter: Payer: Self-pay | Admitting: Gynecologic Oncology

## 2013-03-07 ENCOUNTER — Other Ambulatory Visit (HOSPITAL_COMMUNITY)
Admission: RE | Admit: 2013-03-07 | Discharge: 2013-03-07 | Disposition: A | Discharge status: Home / Self Care | Payer: Medicare PPO | Source: Ambulatory Visit | Attending: Gynecologic Oncology | Admitting: Gynecologic Oncology

## 2013-03-07 ENCOUNTER — Other Ambulatory Visit: Payer: Self-pay | Admitting: Gynecologic Oncology

## 2013-03-07 ENCOUNTER — Other Ambulatory Visit: Payer: Medicare PPO | Admitting: Lab

## 2013-03-07 VITALS — BP 108/60 | HR 70 | Temp 98.6°F | Resp 16 | Ht 63.19 in | Wt 250.9 lb

## 2013-03-07 DIAGNOSIS — C55 Malignant neoplasm of uterus, part unspecified: Secondary | ICD-10-CM

## 2013-03-07 DIAGNOSIS — C541 Malignant neoplasm of endometrium: Secondary | ICD-10-CM

## 2013-03-07 DIAGNOSIS — C549 Malignant neoplasm of corpus uteri, unspecified: Secondary | ICD-10-CM | POA: Insufficient documentation

## 2013-03-07 DIAGNOSIS — Z124 Encounter for screening for malignant neoplasm of cervix: Secondary | ICD-10-CM | POA: Insufficient documentation

## 2013-03-07 LAB — CBC WITH DIFFERENTIAL/PLATELET
BASO%: 0.2 % (ref 0.0–2.0)
Eosinophils Absolute: 0.2 10*3/uL (ref 0.0–0.5)
HCT: 35.5 % (ref 34.8–46.6)
LYMPH%: 27.9 % (ref 14.0–49.7)
MCHC: 31.8 g/dL (ref 31.5–36.0)
MCV: 93.2 fL (ref 79.5–101.0)
MONO#: 0.4 10*3/uL (ref 0.1–0.9)
MONO%: 7.7 % (ref 0.0–14.0)
NEUT%: 59.3 % (ref 38.4–76.8)
Platelets: 163 10*3/uL (ref 145–400)
RBC: 3.81 10*6/uL (ref 3.70–5.45)
WBC: 4.5 10*3/uL (ref 3.9–10.3)

## 2013-03-07 NOTE — Patient Instructions (Signed)
Return to see gynecologic oncology in 12/14.

## 2013-03-07 NOTE — Telephone Encounter (Signed)
Patient notified of CBC results.  No concerns voiced.  Patient instructed to call for any needs or concerns.

## 2013-03-07 NOTE — Progress Notes (Signed)
Consult Note: Gyn-Onc  [name] 67 y.o. female  CC:  Chief Complaint  Patient presents with  . 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   HISTORY OF PRESENT ILLNESS: This is a 67 year old who reported to Dr. Penne Lash in December 2011 with a 21-month history of uterine bleeding, the endometrial biopsy demonstrated presence of a grade 1 endometrial cancer. There was initial plan for hysterectomy with lymph node dissection. However, the patient noted to have tumor that rapidly progressed and involved the cervix. The PET scan demonstrated uterine cancer with significant cervical involvement. She received 3 cycles of neoadjuvant Taxol, carboplatin therapy, then underwent a robotic-  assisted laparoscopic hysterectomy, bilateral salpingo-oophorectomy, and lymph node sampling. Final pathology demonstrated invasive endometrioid adenocarcinoma involving the outer half of the myometrium and involving the cervical stroma, angiolymphatic invasion was present and the tumor was noted to be FIGO grade II. She subsequently received additional 3 cycles of adjuvant Taxol, carboplatin therapy, completed in March 2012.  Based on the positive lymph nodes, she received external beam radiation therapy for a dose to the pelvis area 400-500 centigrade. She then underwent intracavitary brachytherapy, which was completed in June 2012.  The cumulative dose to the proximal vagina was 600-300 centigrade.  She had a CT scan of the chest abdomen and pelvis October 2013 that showed no evidence of recurrent disease. She also underwent a port removal at that time. She did see her primary care physician secondary to the CT of the chest findings of cardiomegaly and pulmonary artery enlargement suggestive of pulmonary arterial hypertension. I last saw her in October at which time her exam was unremarkable. Pap smear with Dr. Sharlett Iles in December 2013 revealed no evidence of  malignancy.  Interval History:  We last saw her in 08/2012. She presented to the emergency room in November 2013 with complaints of abdominal pain and vaginal bleeding. She saw Melissa cause her nurse practitioner at that time on to the right of the right labia majora there is a sore noted to have continual amount of serous drainage. A prescription was most likely secondary to a ruptured sebaceous cyst. Since that time she's had no further evidence of any bleeding. She was seen in the emergency room for sinusitis. And has followup with her primary physician. She is currently on Antivert as well as Levaquin. She's not using a nasal steroid as she's had improvement since starting her Levaquin. The patient will also had some low blood pressures and she was encouraged to increase salt intake and beverages for 2-3 days with help with her blood pressure and her dizziness.  She is overall doing fairly well she does complain of a lot of stress. Her husband has a brain tumor and he declined surgery.  He is an "invalid" at home and she is caring for him. He does have a nurse that comes out 3 times a week to help. 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   Review of Systems  No nausea, vomiting, fever, chills, or abdominal pain. No diarrhea, constipation, or lower extremity edema. No hematochezia, hematocolpos, or hematuria. She has a cough from her sinus drainage. She described as cloudy but is not yellow or green. She has, nausea secondary to her postnasal drainage. However, she states that she is getting better since being on Levaquin. She no longer states that she feels lightheaded or dizzy. She denies any headaches. Otherwise 10-point review of systems  negative.  Weight loss has  plateaued but she continues on her diet..  She had a negative MMG 8/13 and normal bone density.    Current Meds:  Outpatient Encounter Prescriptions as of 03/07/2013  Medication Sig Dispense Refill  . Cholecalciferol (VITAMIN  D3) 1000 UNITS CAPS Take 2,000 Units by mouth daily.        Marland Kitchen FLUoxetine (PROZAC) 20 MG capsule Take 20 mg by mouth 3 (three) times daily.      Marland Kitchen levofloxacin (LEVAQUIN) 500 MG tablet Take 500 mg by mouth daily.      . Multiple Vitamin (MULTIVITAMIN) capsule Take 1 capsule by mouth daily.        . Ondansetron HCl (ZOFRAN PO) Take by mouth as needed.      . pantoprazole (PROTONIX) 40 MG tablet Take 1 tablet (40 mg total) by mouth daily.  30 tablet  6  . senna-docusate (SENOKOT-S) 8.6-50 MG per tablet Take 1 tablet by mouth daily as needed. For constipation      . traZODone (DESYREL) 50 MG tablet Take 50-150 mg by mouth at bedtime.      . vitamin B-12 (CYANOCOBALAMIN) 1000 MCG tablet Take 1,000 mcg by mouth daily.        . Ferrous Gluconate (IRON) 240 (27 FE) MG TABS Take 1 tablet by mouth daily.      Marland Kitchen HYDROcodone-acetaminophen (NORCO) 5-325 MG per tablet Take 1 tablet by mouth every 6 (six) hours as needed for pain.      . meclizine (ANTIVERT) 25 MG tablet Take 25 mg by mouth every 8 (eight) hours.      . promethazine (PHENERGAN) 25 MG tablet Take 1 tablet (25 mg total) by mouth every 6 (six) hours as needed for nausea.  30 tablet  0   No facility-administered encounter medications on file as of 03/07/2013.    Allergy: No Known Allergies  Social Hx:   History   Social History  . Marital Status: Married    Spouse Name: N/A    Number of Children: 1  . 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 Smoking status: Former Games developer  . Smokeless tobacco: Never Used  . Alcohol Use: No     Comment: Former alcoholic  . Drug Use: No  . Sexually Active: Not on file   Other Topics Concern  . Not on file   Social History Narrative  . No narrative on file    Past Surgical Hx:  Past Surgical History  Procedure Laterality Date  . Portacath placement  Jan 2012  . Abdominal hysterectomy  2011    Past Medical Hx:  Past Medical History   Diagnosis Date  . Back pain   . Diverticulosis April 2012  . Depression   . Uterine cancer 2011    endometrialca  . Hx of radiation therapy 03/08/11-04/18/11& 5/29,6/04/03/11/12    external beam and intracavity brachytherapy  . Anxiety   . Arthritis     degenerative  . Obesity   . Anemia     multifactorial,r/t chemo and radiation also  . History of mammogram 06/14/11    b/l mammogram    Family Hx:  Family History  Problem Relation Age of Onset  . Lung cancer Mother   . Pulmonary fibrosis Mother   . Heart attack Father   . Diabetes Father   . Colon cancer Neg Hx   . Rectal cancer  Neg Hx   . Stomach cancer Neg Hx     Vitals:  Blood pressure 108/60, pulse 70, temperature 98.6 F (37 C), resp. rate 16, height 5' 3.19" (1.605 m), weight 250 lb 14.4 oz (113.807 kg).  Physical Exam:  Well-nourished well-developed female in no acute distress.  CHEST: Clear to auscultation.   HEART: Regular rate and rhythm.   LYMPH NODE SURVEY: No cervical, supraclavicular, or inguinal adenopathy.   ABDOMEN: Soft, obese, nontender. Exam limited by habitus. 8 cm plaque of candida on left in the area of the groin where there is overhanging pannus.  EXTREMITIES: 1+ NP edema  PELVIC: Normal external genitalia, Bartholin's, urethral and Skene's. Atrophic vagina. Small amount of vaginal discharge. Pap smears submitted. No masses or nodularity.   RECTAL: Good tone. No cul-de-sac masses. No palpable lesions in the rectovaginal septum.   Assessment/Plan:  IMPRESSION: Ms. Gouin is a 67 year old without any evidence of disease from her stage III C endometrial cancer diagnosed in 12/11. It is now 2.4 years out from her diagnosis.   PLAN: We will followup on the results of your Pap smear from today. She'll follow up with Dr. Roselind Messier in 4 months and return to see [place] in 8. We will check a CBC today due to her concerns regarding her BP.     Orville Mena A., MD 03/07/2013, 11:23 AM

## 2013-03-14 ENCOUNTER — Telehealth: Payer: Self-pay | Admitting: Gynecologic Oncology

## 2013-03-14 NOTE — Telephone Encounter (Signed)
Pt notified about pap results: negative.  No questions or concerns voiced. 

## 2013-03-19 ENCOUNTER — Encounter (HOSPITAL_COMMUNITY): Payer: Self-pay

## 2013-03-19 ENCOUNTER — Emergency Department (HOSPITAL_COMMUNITY)
Admission: EM | Admit: 2013-03-19 | Discharge: 2013-03-19 | Disposition: A | Discharge status: Home / Self Care | Payer: Medicare PPO | Attending: Emergency Medicine | Admitting: Emergency Medicine

## 2013-03-19 ENCOUNTER — Encounter: Payer: Self-pay | Admitting: Cardiology

## 2013-03-19 ENCOUNTER — Emergency Department (HOSPITAL_COMMUNITY): Payer: Medicare PPO

## 2013-03-19 DIAGNOSIS — Z8719 Personal history of other diseases of the digestive system: Secondary | ICD-10-CM | POA: Insufficient documentation

## 2013-03-19 DIAGNOSIS — E669 Obesity, unspecified: Secondary | ICD-10-CM | POA: Insufficient documentation

## 2013-03-19 DIAGNOSIS — Y9389 Activity, other specified: Secondary | ICD-10-CM | POA: Insufficient documentation

## 2013-03-19 DIAGNOSIS — F411 Generalized anxiety disorder: Secondary | ICD-10-CM | POA: Insufficient documentation

## 2013-03-19 DIAGNOSIS — R42 Dizziness and giddiness: Secondary | ICD-10-CM | POA: Insufficient documentation

## 2013-03-19 DIAGNOSIS — Z923 Personal history of irradiation: Secondary | ICD-10-CM | POA: Insufficient documentation

## 2013-03-19 DIAGNOSIS — W1809XA Striking against other object with subsequent fall, initial encounter: Secondary | ICD-10-CM | POA: Insufficient documentation

## 2013-03-19 DIAGNOSIS — Y9289 Other specified places as the place of occurrence of the external cause: Secondary | ICD-10-CM | POA: Insufficient documentation

## 2013-03-19 DIAGNOSIS — M129 Arthropathy, unspecified: Secondary | ICD-10-CM | POA: Insufficient documentation

## 2013-03-19 DIAGNOSIS — D649 Anemia, unspecified: Secondary | ICD-10-CM | POA: Insufficient documentation

## 2013-03-19 DIAGNOSIS — Z8669 Personal history of other diseases of the nervous system and sense organs: Secondary | ICD-10-CM | POA: Insufficient documentation

## 2013-03-19 DIAGNOSIS — M542 Cervicalgia: Secondary | ICD-10-CM

## 2013-03-19 DIAGNOSIS — R55 Syncope and collapse: Secondary | ICD-10-CM

## 2013-03-19 DIAGNOSIS — S0990XA Unspecified injury of head, initial encounter: Secondary | ICD-10-CM | POA: Insufficient documentation

## 2013-03-19 DIAGNOSIS — Z87891 Personal history of nicotine dependence: Secondary | ICD-10-CM | POA: Insufficient documentation

## 2013-03-19 DIAGNOSIS — Z8679 Personal history of other diseases of the circulatory system: Secondary | ICD-10-CM | POA: Insufficient documentation

## 2013-03-19 DIAGNOSIS — Z8542 Personal history of malignant neoplasm of other parts of uterus: Secondary | ICD-10-CM | POA: Insufficient documentation

## 2013-03-19 DIAGNOSIS — S0993XA Unspecified injury of face, initial encounter: Secondary | ICD-10-CM | POA: Insufficient documentation

## 2013-03-19 DIAGNOSIS — Z79899 Other long term (current) drug therapy: Secondary | ICD-10-CM | POA: Insufficient documentation

## 2013-03-19 DIAGNOSIS — F3289 Other specified depressive episodes: Secondary | ICD-10-CM | POA: Insufficient documentation

## 2013-03-19 DIAGNOSIS — F329 Major depressive disorder, single episode, unspecified: Secondary | ICD-10-CM | POA: Insufficient documentation

## 2013-03-19 HISTORY — DX: Unspecified cataract: H26.9

## 2013-03-19 LAB — CBC
HCT: 34.5 % — ABNORMAL LOW (ref 36.0–46.0)
MCH: 29.9 pg (ref 26.0–34.0)
MCHC: 32.5 g/dL (ref 30.0–36.0)
MCV: 92 fL (ref 78.0–100.0)
Platelets: 176 10*3/uL (ref 150–400)
RDW: 15.8 % — ABNORMAL HIGH (ref 11.5–15.5)
WBC: 5.2 10*3/uL (ref 4.0–10.5)

## 2013-03-19 LAB — BASIC METABOLIC PANEL
BUN: 19 mg/dL (ref 6–23)
Calcium: 9 mg/dL (ref 8.4–10.5)
Creatinine, Ser: 0.91 mg/dL (ref 0.50–1.10)
GFR calc Af Amer: 73 mL/min — ABNORMAL LOW (ref >90)
GFR calc non Af Amer: 63 mL/min — ABNORMAL LOW (ref >90)

## 2013-03-19 LAB — TROPONIN I: Troponin I: <0.3 ng/mL (ref <0.30)

## 2013-03-19 MED ORDER — IBUPROFEN 600 MG PO TABS: 600.0000 mg | ORAL_TABLET | Freq: Three times a day (TID) | ORAL | Status: DC | PRN

## 2013-03-19 MED ORDER — ONDANSETRON HCL 4 MG/2ML IJ SOLN
4.0000 mg | Freq: Once | INTRAMUSCULAR | Status: AC
  Administered 2013-03-19: 4 mg via INTRAVENOUS
  Filled 2013-03-19: qty 2

## 2013-03-19 MED ORDER — MORPHINE SULFATE 4 MG/ML IJ SOLN
6.0000 mg | Freq: Once | INTRAMUSCULAR | Status: AC
  Administered 2013-03-19: 6 mg via INTRAVENOUS
  Filled 2013-03-19: qty 2

## 2013-03-19 NOTE — ED Notes (Signed)
Bed:WA08<BR> Expected date:<BR> Expected time:<BR> Means of arrival:<BR> Comments:<BR> ems

## 2013-03-19 NOTE — ED Notes (Signed)
Per EMS pt was standing at the desk and had a syncopal episode hit rt side of head. No neck or back pain, hx of ca 74yrs ago states has light headedness since. Pt now a/o x4

## 2013-03-19 NOTE — ED Provider Notes (Signed)
History     CSN: [id number]  Arrival date & time 03/19/13  1110   First MD Initiated Contact with Patient 03/19/13 1116      Chief Complaint  Patient presents with  . Loss of Consciousness    (Consider location/radiation/quality/duration/timing/severity/associated sxs/prior treatment) The history is provided by the patient.   patient reports a syncopal episode while standing.  She states she was standing was having no chest pain or palpitations when she suddenly felt the world turned black and passed out, striking her head on the chair.  I probably said that her head "bounced".  She reports headache and neck pain at this time.  She denies weakness of her upper lower extremities.  No preceding chest pain or palpitations.  No chest pain or palpitations at this time.  No shortness of breath.  She reports she felt normal this morning and was had no recent nausea vomiting or diarrhea.  She's been eating and drinking normally.  She had a normal meal this morning.  No seizure activity noted.  Immediate return of consciousness.  Besides headache and neck pain the patient is asymptomatic at this time.  Past Medical History  Diagnosis Date  . Back pain   . Diverticulosis April 2012  . Depression   . Uterine cancer 2011    endometrialca  . Hx of radiation therapy 03/08/11-04/18/11& 5/29,6/04/03/11/12    external beam and intracavity brachytherapy  . Anxiety   . Arthritis     degenerative  . Obesity   . Anemia     multifactorial,r/t chemo and radiation also  . History of mammogram 06/14/11    b/l mammogram  . Cataract     Past Surgical History  Procedure Laterality Date  . Portacath placement  Jan 2012  . Abdominal hysterectomy  2011    Family History  Problem Relation Age of Onset  . Lung cancer Mother   . Pulmonary fibrosis Mother   . Heart attack Father   . Diabetes Father   . Colon cancer Neg Hx   . Rectal cancer Neg Hx   . Stomach cancer Neg Hx     History  Substance Use  Topics  . Smoking status: Former Games developer  . Smokeless tobacco: Never Used  . Alcohol Use: No     Comment: Former alcoholic    OB History   Grav Para Term Preterm Abortions TAB SAB Ect Mult Living                  Review of Systems  Cardiovascular: Positive for syncope.  All other systems reviewed and are negative.    Allergies  Review of patient's allergies indicates no known allergies.  Home Medications   Current Outpatient Rx  Name  Route  Sig  Dispense  Refill  . Cholecalciferol (VITAMIN D3) 1000 UNITS CAPS   Oral   Take 2,000 Units by mouth daily.           . Ferrous Gluconate (IRON) 240 (27 FE) MG TABS   Oral   Take 1 tablet by mouth daily.         Marland Kitchen FLUoxetine (PROZAC) 20 MG capsule   Oral   Take 20 mg by mouth 3 (three) times daily.         Marland Kitchen ketorolac (ACULAR) 0.4 % SOLN   Left Eye   Place 1 drop into the left eye 4 (four) times daily.         . meclizine (ANTIVERT) 25 MG tablet  Oral   Take 25 mg by mouth every 8 (eight) hours.         . moxifloxacin (VIGAMOX) 0.5 % ophthalmic solution   Left Eye   Place 1 drop into the left eye 4 (four) times daily.         . Multiple Vitamin (MULTIVITAMIN) capsule   Oral   Take 1 capsule by mouth daily.           . Ondansetron HCl (ZOFRAN PO)   Oral   Take by mouth as needed.         . pantoprazole (PROTONIX) 40 MG tablet   Oral   Take 1 tablet (40 mg total) by mouth daily.   30 tablet   6   . prednisoLONE acetate (PRED FORTE) 1 % ophthalmic suspension   Left Eye   Place 1 drop into the left eye 4 (four) times daily.         . promethazine (PHENERGAN) 25 MG tablet   Oral   Take 1 tablet (25 mg total) by mouth every 6 (six) hours as needed for nausea.   30 tablet   0   . senna-docusate (SENOKOT-S) 8.6-50 MG per tablet   Oral   Take 1 tablet by mouth daily as needed. For constipation         . traZODone (DESYREL) 50 MG tablet   Oral   Take 50-150 mg by mouth at bedtime.          . vitamin B-12 (CYANOCOBALAMIN) 1000 MCG tablet   Oral   Take 1,000 mcg by mouth daily.             BP 115/61  Pulse 71  Temp(Src) 98 F (36.7 C) (Oral)  SpO2 98%  Physical Exam  Nursing note and vitals reviewed. Constitutional: She is oriented to person, place, and time. She appears well-developed and well-nourished. No distress.  HENT:  Head: Normocephalic and atraumatic.  Eyes: EOM are normal.  Neck: Neck supple.  Cervical spine immobilized in a c-collar on arrival.  The patient with cervical and paracervical tenderness.  Paracervical tenderness is on the right.  No cervical step-offs noted.  Cardiovascular: Normal rate, regular rhythm and normal heart sounds.   Pulmonary/Chest: Effort normal and breath sounds normal.  Abdominal: Soft. She exhibits no distension. There is no tenderness.  Musculoskeletal: Normal range of motion.  Neurological: She is alert and oriented to person, place, and time.  5 out of 5 strength in bilateral upper lower extremities major muscle groups.   Skin: Skin is warm and dry.  Psychiatric: She has a normal mood and affect. Judgment normal.    ED Course  Procedures (including critical care time)   Date: 03/19/2013  Rate: 69  Rhythm: normal sinus rhythm  QRS Axis: normal  Intervals: normal  ST/T Wave abnormalities: Inversions Conduction Disutrbances: none  Narrative Interpretation:   Old EKG Reviewed: No significant changes noted     Labs Reviewed  CBC - Abnormal; Notable for the following:    RBC 3.75 (*)    Hemoglobin 11.2 (*)    HCT 34.5 (*)    RDW 15.8 (*)    All other components within normal limits  BASIC METABOLIC PANEL - Abnormal; Notable for the following:    Glucose, Bld 102 (*)    GFR calc non Af Amer 63 (*)    GFR calc Af Amer 73 (*)    All other components within normal limits  TROPONIN I  No results found.   1. Syncope   2. Neck pain       MDM  2:10 PM Patient feels much better at this time.   Discharge home.  She'll ambulate around the emergency department.  The patient was on telemetry while here in the ER and no malignant arrhythmias were noted.  No chest pain or palpitations.  Labs without significant abnormality.  Discharge home in good condition.  EKG is unchanged.  Home with PCP and cardiology followup.  CT head and C-spine are normal        Lyanne Co, MD 03/19/13 1410

## 2013-03-21 HISTORY — PX: CATARACT EXTRACTION W/PHACO: SHX586

## 2013-03-22 ENCOUNTER — Encounter: Payer: Self-pay | Admitting: Family Medicine

## 2013-03-22 ENCOUNTER — Ambulatory Visit: Payer: Medicare PPO | Admitting: Family Medicine

## 2013-03-28 HISTORY — PX: LOOP RECORDER IMPLANT: SHX5954

## 2013-04-01 ENCOUNTER — Other Ambulatory Visit: Payer: Self-pay | Admitting: *Deleted

## 2013-04-01 ENCOUNTER — Encounter (HOSPITAL_COMMUNITY): Payer: Self-pay | Admitting: Pharmacy Technician

## 2013-04-01 ENCOUNTER — Encounter: Payer: Self-pay | Admitting: *Deleted

## 2013-04-01 ENCOUNTER — Ambulatory Visit (INDEPENDENT_AMBULATORY_CARE_PROVIDER_SITE_OTHER): Payer: Medicare PPO | Admitting: Cardiology

## 2013-04-01 ENCOUNTER — Encounter: Payer: Self-pay | Admitting: Cardiology

## 2013-04-01 VITALS — BP 124/76 | HR 85 | Ht 65.0 in | Wt 256.0 lb

## 2013-04-01 DIAGNOSIS — R0602 Shortness of breath: Secondary | ICD-10-CM

## 2013-04-01 DIAGNOSIS — R55 Syncope and collapse: Secondary | ICD-10-CM

## 2013-04-01 DIAGNOSIS — R0609 Other forms of dyspnea: Secondary | ICD-10-CM

## 2013-04-01 DIAGNOSIS — I359 Nonrheumatic aortic valve disorder, unspecified: Secondary | ICD-10-CM

## 2013-04-01 DIAGNOSIS — R0600 Dyspnea, unspecified: Secondary | ICD-10-CM

## 2013-04-01 DIAGNOSIS — I350 Nonrheumatic aortic (valve) stenosis: Secondary | ICD-10-CM

## 2013-04-01 NOTE — Patient Instructions (Addendum)
Your physician recommends that you schedule a follow-up appointment in: 6-8 WEEKS WITH DR Jens Som  Your physician recommends that you HAVE LAB WORK TODAY  Your physician has requested that you have a lexiscan myoview. For further information please visit [email or web address] Please follow instruction sheet, as given.

## 2013-04-01 NOTE — Assessment & Plan Note (Addendum)
Etiology unclear. Electrocardiogram shows anterior T-wave inversion slightly worse compared to previous. She has occasional chest tightness when stressed. Schedule Myoview for risk stratification. Check d-dimer (question RV strain from pulmonary embolus). If abnormal she will need a chest CT. I'll also ask for an implantable loop recorder. Patient instructed not to drive.

## 2013-04-01 NOTE — Assessment & Plan Note (Signed)
Mild on most recent echo. Plan followup echoes in the future.

## 2013-04-01 NOTE — Progress Notes (Signed)
HPI: Plesant female I initially saw in Oct 2013 for evaluation of cardiac enlargement and pulmonary artery enlargement. The patient has a history of uterine cancer. She recently had a CT scan of her abdomen and chest. The CT of her chest showed no metastatic disease. There was note of cardiomegaly and pulmonary artery enlargement suggestive of pulmonary hypertension. Laboratories in September of 2013 showed normal renal function, normal liver functions, normocytic anemia with a hemoglobin of 10.1. Echocardiogram in October of 2013 showed an ejection fraction of 65-70%, grade 2 diastolic dysfunction, mild biatrial enlargement and mild aortic stenosis. TR velocity was 2.5 m/s. Lower extremity Dopplers in November of 2013 showed no DVT. CardioNet performed for near syncope showed sinus rhythm. I last saw her in Dec 2013. Since then, the patient had a syncopal episode on April 22. She was at church standing behind a desk talking to another person and found herself on the floor. No preceding chest pain, palpitations, dyspnea, nausea, diaphoresis and no loss of strength or sensation in her extremities. No incontinence or seizure activity. She was unconscious for several seconds. Felt washed out afterwards. She denies dyspnea on exertion but has had pedal edema. She has occasional chest tightness when she feels stressed about her husband who has a brain tumor. No exertional chest pain.   Current Outpatient Prescriptions  Medication Sig Dispense Refill  . Cholecalciferol (VITAMIN D3) 1000 UNITS CAPS Take 2,000 Units by mouth daily.        . Ferrous Gluconate (IRON) 240 (27 FE) MG TABS Take 1 tablet by mouth daily.      Marland Kitchen FLUoxetine (PROZAC) 20 MG capsule Take 20 mg by mouth 3 (three) times daily.      Marland Kitchen ibuprofen (ADVIL,MOTRIN) 600 MG tablet Take 1 tablet (600 mg total) by mouth every 8 (eight) hours as needed for pain.  15 tablet  0  . Multiple Vitamin (MULTIVITAMIN) capsule Take 1 capsule by mouth daily.         . Ondansetron HCl (ZOFRAN PO) Take by mouth as needed.      . pantoprazole (PROTONIX) 40 MG tablet Take 40 mg by mouth daily.      Marland Kitchen senna-docusate (SENOKOT-S) 8.6-50 MG per tablet Take 1 tablet by mouth daily as needed. For constipation      . traZODone (DESYREL) 50 MG tablet Take 50-150 mg by mouth at bedtime.      . vitamin B-12 (CYANOCOBALAMIN) 1000 MCG tablet Take 1,000 mcg by mouth daily.         No current facility-administered medications for this visit.     Past Medical History  Diagnosis Date  . Back pain   . Diverticulosis April 2012  . Depression   . Uterine cancer 2011    endometrialca  . Hx of radiation therapy 03/08/11-04/18/11& 5/29,6/04/03/11/12    external beam and intracavity brachytherapy  . Anxiety   . Arthritis     degenerative  . Obesity   . Anemia     multifactorial,r/t chemo and radiation also  . History of mammogram 06/14/11    b/l mammogram  . Cataract   . Aortic stenosis     Past Surgical History  Procedure Laterality Date  . Portacath placement  Jan 2012  . Abdominal hysterectomy  2011  . Cataract extraction w/phaco Left 03/21/2013    Dr. Hardie Shackleton    History   Social History  . Marital Status: Married    Spouse Name: N/A    Number of Children: 1  .  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 Smoking status: Former Games developer  . Smokeless tobacco: Never Used  . Alcohol Use: No     Comment: Former alcoholic  . Drug Use: No  . Sexually Active: Not on file   Other Topics Concern  . Not on file   Social History Narrative  . No narrative on file    ROS: no fevers or chills, productive cough, hemoptysis, dysphasia, odynophagia, melena, hematochezia, dysuria, hematuria, rash, seizure activity, orthopnea, PND, claudication. Remaining systems are negative.  Physical Exam: Well-developed obesity in no acute distress.  Skin is warm and dry.  HEENT is normal.  Neck is supple.  Chest  is clear to auscultation with normal expansion.  Cardiovascular exam is regular rate and rhythm. 2/6 systolic murmur left sternal border. Abdominal exam nontender or distended. No masses palpated. Extremities show no edema. neuro grossly intact  ECG 03/20/2003 EMS trip shows sinus rhythm with anterior T-wave inversion. RV conduction delay.

## 2013-04-01 NOTE — Assessment & Plan Note (Signed)
No significant dyspnea at present.she may need sleep study in the future.

## 2013-04-02 ENCOUNTER — Telehealth: Payer: Self-pay | Admitting: Cardiology

## 2013-04-02 NOTE — Telephone Encounter (Signed)
Spoke with pt, questions regarding implantable loop recorder answered.

## 2013-04-02 NOTE — Telephone Encounter (Signed)
New problem   Pt stated she is unclear as to what she is suppose to be doing Friday and where she is suppose to be going also. Please call pt

## 2013-04-04 MED ORDER — SODIUM CHLORIDE 0.9 % IR SOLN
80.0000 mg | Status: DC
  Filled 2013-04-04: qty 2

## 2013-04-04 MED ORDER — CHLORHEXIDINE GLUCONATE 4 % EX LIQD
60.0000 mL | Freq: Once | CUTANEOUS | Status: DC
  Filled 2013-04-04: qty 60

## 2013-04-04 MED ORDER — CEFAZOLIN SODIUM 1-5 GM-% IV SOLN
1.0000 g | INTRAVENOUS | Status: DC
  Filled 2013-04-04: qty 50

## 2013-04-05 ENCOUNTER — Ambulatory Visit (HOSPITAL_COMMUNITY)
Admission: RE | Admit: 2013-04-05 | Discharge: 2013-04-05 | Disposition: A | Discharge status: Home / Self Care | Payer: Medicare PPO | Source: Ambulatory Visit | Attending: Internal Medicine | Admitting: Internal Medicine

## 2013-04-05 ENCOUNTER — Encounter (HOSPITAL_COMMUNITY)
Admission: RE | Disposition: A | Discharge status: Home / Self Care | Payer: Self-pay | Source: Ambulatory Visit | Attending: Internal Medicine

## 2013-04-05 DIAGNOSIS — F411 Generalized anxiety disorder: Secondary | ICD-10-CM | POA: Insufficient documentation

## 2013-04-05 DIAGNOSIS — M129 Arthropathy, unspecified: Secondary | ICD-10-CM | POA: Insufficient documentation

## 2013-04-05 DIAGNOSIS — D649 Anemia, unspecified: Secondary | ICD-10-CM | POA: Insufficient documentation

## 2013-04-05 DIAGNOSIS — Z79899 Other long term (current) drug therapy: Secondary | ICD-10-CM | POA: Insufficient documentation

## 2013-04-05 DIAGNOSIS — R55 Syncope and collapse: Secondary | ICD-10-CM

## 2013-04-05 DIAGNOSIS — M1990 Unspecified osteoarthritis, unspecified site: Secondary | ICD-10-CM | POA: Insufficient documentation

## 2013-04-05 DIAGNOSIS — Z8249 Family history of ischemic heart disease and other diseases of the circulatory system: Secondary | ICD-10-CM | POA: Insufficient documentation

## 2013-04-05 DIAGNOSIS — K5730 Diverticulosis of large intestine without perforation or abscess without bleeding: Secondary | ICD-10-CM | POA: Insufficient documentation

## 2013-04-05 DIAGNOSIS — I359 Nonrheumatic aortic valve disorder, unspecified: Secondary | ICD-10-CM | POA: Insufficient documentation

## 2013-04-05 DIAGNOSIS — Z6841 Body Mass Index (BMI) 40.0 and over, adult: Secondary | ICD-10-CM | POA: Insufficient documentation

## 2013-04-05 DIAGNOSIS — Z923 Personal history of irradiation: Secondary | ICD-10-CM | POA: Insufficient documentation

## 2013-04-05 DIAGNOSIS — Z8542 Personal history of malignant neoplasm of other parts of uterus: Secondary | ICD-10-CM | POA: Insufficient documentation

## 2013-04-05 DIAGNOSIS — E669 Obesity, unspecified: Secondary | ICD-10-CM | POA: Insufficient documentation

## 2013-04-05 DIAGNOSIS — Z87891 Personal history of nicotine dependence: Secondary | ICD-10-CM | POA: Insufficient documentation

## 2013-04-05 HISTORY — PX: LOOP RECORDER IMPLANT: SHX5477

## 2013-04-05 LAB — SURGICAL PCR SCREEN: Staphylococcus aureus: NEGATIVE

## 2013-04-05 SURGERY — LOOP RECORDER IMPLANT
Anesthesia: LOCAL | Laterality: Left

## 2013-04-05 MED ORDER — ACETAMINOPHEN 325 MG PO TABS: 325.0000 mg | ORAL_TABLET | ORAL | Status: DC | PRN

## 2013-04-05 MED ORDER — MUPIROCIN 2 % EX OINT
TOPICAL_OINTMENT | Freq: Two times a day (BID) | CUTANEOUS | Status: DC
  Filled 2013-04-05: qty 22

## 2013-04-05 MED ORDER — ONDANSETRON HCL 4 MG/2ML IJ SOLN: 4.0000 mg | Freq: Four times a day (QID) | INTRAMUSCULAR | Status: DC | PRN

## 2013-04-05 MED ORDER — MUPIROCIN 2 % EX OINT
TOPICAL_OINTMENT | CUTANEOUS | Status: AC
  Filled 2013-04-05: qty 22

## 2013-04-05 NOTE — Op Note (Signed)
NAMECHESNIE, CAPELL NO.:  [id number]  MEDICAL RECORD NO.:  [id number]  LOCATION:  [facility]                         FACILITY:  [facility]  PHYSICIAN:  Doylene Canning. Ladona Ridgel, MD    DATE OF BIRTH:  1946/08/18  DATE OF PROCEDURE:  04/05/2013 DATE OF DISCHARGE:  04/05/2013                              OPERATIVE REPORT   PROCEDURE:  Insertion of implantable loop recorder.  INDICATION:  Recurrent unexplained syncope.  INTRODUCTION:  The patient is a 67 year old woman with recurrent episodes of syncope.  These episodes occur suddenly without warning, lasting just a few seconds.  She has chronic cardiac monitor and had no obvious cause of her syncope.  She is now referred for insertion of an implantable loop recorder.  DESCRIPTION OF PROCEDURE:  After informed was obtained, the patient was taken to the catheterization lab in a fasting state.  After the usual preparation, subcutaneous lidocaine was infiltrated into the left pectoral region.  The Medtronic Reveal LINQ implantable loop recorder serial [id number] was inserted subcutaneously without difficulty.  The pressure dressing was placed over the skin after manual pressure was held assuring hemostasis, and she was returned to the recovery area in satisfactory condition.  COMPLICATIONS:  There were no immediate procedure complications.  RESULTS:  This demonstrates successful insertion of a Medtronic implantable loop recorder without immediate procedure complication.     Doylene Canning. Ladona Ridgel, MD     GWT/MEDQ  D:  04/05/2013  T:  04/05/2013  Job:  [id number]

## 2013-04-05 NOTE — Op Note (Signed)
ILR insertion without immediate complication. [id number].

## 2013-04-05 NOTE — H&P (Signed)
[name] is an 67 y.o. female.   Chief Complaint: "I am here for a loop recorder HPI: The patient is a 67 year old woman with a history of unexplained syncope, obesity, and arthritis. Subsequent evaluation has demonstrated normal left ventricular function. Cardiac monitoring has been negative. She has had several episodes which occur suddenly and without warning. No loss of bowel or bladder continence. No tongue biting or seizure activity. He  Past Medical History  Diagnosis Date  . Back pain   . Diverticulosis April 2012  . Depression   . Uterine cancer 2011    endometrialca  . Hx of radiation therapy 03/08/11-04/18/11& 5/29,6/04/03/11/12    external beam and intracavity brachytherapy  . Anxiety   . Arthritis     degenerative  . Obesity   . Anemia     multifactorial,r/t chemo and radiation also  . History of mammogram 06/14/11    b/l mammogram  . Cataract   . Aortic stenosis     Past Surgical History  Procedure Laterality Date  . Portacath placement  Jan 2012  . Abdominal hysterectomy  2011  . Cataract extraction w/phaco Left 03/21/2013    Dr. Hardie Shackleton    Family History  Problem Relation Age of Onset  . Lung cancer Mother   . Pulmonary fibrosis Mother   . Heart attack Father   . Diabetes Father   . Colon cancer Neg Hx   . Rectal cancer Neg Hx   . Stomach cancer Neg Hx    Social History:  reports that she has quit smoking. She has never used smokeless tobacco. She reports that she does not drink alcohol or use illicit drugs.  Allergies: No Known Allergies  Medications Prior to Admission  Medication Sig Dispense Refill  . Cholecalciferol (VITAMIN D3) 1000 UNITS CAPS Take 2,000 Units by mouth daily.        . Ferrous Gluconate (IRON) 240 (27 FE) MG TABS Take 1 tablet by mouth daily.      Marland Kitchen FLUoxetine (PROZAC) 20 MG capsule Take 20 mg by mouth 3 (three) times daily.      Marland Kitchen ibuprofen (ADVIL,MOTRIN) 600 MG tablet Take 1 tablet (600 mg total) by mouth every 8 (eight) hours  as needed for pain.  15 tablet  0  . Multiple Vitamin (MULTIVITAMIN) capsule Take 1 capsule by mouth daily.        . Ondansetron HCl (ZOFRAN PO) Take 1 tablet by mouth every 8 (eight) hours as needed (nausea).       . pantoprazole (PROTONIX) 40 MG tablet Take 40 mg by mouth daily.      Marland Kitchen senna-docusate (SENOKOT-S) 8.6-50 MG per tablet Take 1 tablet by mouth daily as needed. For constipation      . traZODone (DESYREL) 50 MG tablet Take 50-150 mg by mouth at bedtime as needed for sleep.       . vitamin B-12 (CYANOCOBALAMIN) 1000 MCG tablet Take 1,000 mcg by mouth daily.          No results found for this or any previous visit (from the past 48 hour(s)). No results found.  Review of systems All systems reviewed and negative.  Physical exam  Blood pressure 129/58, pulse 82, temperature 98.2 F (36.8 C), temperature source Oral, resp. rate 20, height 5\' 5"  (1.651 m), weight 252 lb (114.306 kg), SpO2 98.00%. Well appearing obese, middle-age woman, NAD HEENT: Unremarkable Neck:  No JVD, no thyromegally Back:  No CVA tenderness Lungs:  Clear with no wheezes, rales,  or rhonchi. HEART:  Regular rate rhythm, no murmurs, no rubs, no clicks Abd:  soft, positive bowel sounds, no organomegally, no rebound, no guarding Ext:  2 plus pulses, no edema, no cyanosis, no clubbing Skin:  No rashes no nodules Neuro:  CN II through XII intact, motor grossly intact  EKG: Normal sinus rhythm  Assessment/Plan 1. unexplained recurrent syncope - I discussed the treatment options with the patient. The risk, goals, benefits, and expectations of insertion of an implantable loop recorder have been discussed with the patient and she wishes to proceed.  Glover Capano,M.D. 04/05/2013, 7:45 AM

## 2013-04-08 ENCOUNTER — Ambulatory Visit (HOSPITAL_COMMUNITY): Payer: Medicare PPO | Attending: Cardiology | Admitting: Radiology

## 2013-04-08 VITALS — Ht 65.0 in | Wt 256.0 lb

## 2013-04-08 DIAGNOSIS — R0609 Other forms of dyspnea: Secondary | ICD-10-CM | POA: Insufficient documentation

## 2013-04-08 DIAGNOSIS — R079 Chest pain, unspecified: Secondary | ICD-10-CM

## 2013-04-08 DIAGNOSIS — Z87891 Personal history of nicotine dependence: Secondary | ICD-10-CM | POA: Insufficient documentation

## 2013-04-08 DIAGNOSIS — R0600 Dyspnea, unspecified: Secondary | ICD-10-CM

## 2013-04-08 DIAGNOSIS — R0789 Other chest pain: Secondary | ICD-10-CM | POA: Insufficient documentation

## 2013-04-08 DIAGNOSIS — R42 Dizziness and giddiness: Secondary | ICD-10-CM | POA: Insufficient documentation

## 2013-04-08 DIAGNOSIS — R0989 Other specified symptoms and signs involving the circulatory and respiratory systems: Secondary | ICD-10-CM | POA: Insufficient documentation

## 2013-04-08 DIAGNOSIS — R9431 Abnormal electrocardiogram [ECG] [EKG]: Secondary | ICD-10-CM | POA: Insufficient documentation

## 2013-04-08 DIAGNOSIS — R532 Functional quadriplegia: Secondary | ICD-10-CM | POA: Insufficient documentation

## 2013-04-08 DIAGNOSIS — R55 Syncope and collapse: Secondary | ICD-10-CM | POA: Insufficient documentation

## 2013-04-08 DIAGNOSIS — R0602 Shortness of breath: Secondary | ICD-10-CM

## 2013-04-08 MED ORDER — TECHNETIUM TC 99M SESTAMIBI GENERIC - CARDIOLITE
30.0000 | Freq: Once | INTRAVENOUS | Status: AC | PRN
  Administered 2013-04-08: 30 via INTRAVENOUS

## 2013-04-08 MED ORDER — REGADENOSON 0.4 MG/5ML IV SOLN
0.4000 mg | Freq: Once | INTRAVENOUS | Status: AC
  Administered 2013-04-08: 0.4 mg via INTRAVENOUS

## 2013-04-08 NOTE — Progress Notes (Signed)
[facility] SITE 3 NUCLEAR MED [address] [place], [place] [address] [phone]    Cardiology Nuclear Med Study  Vyla Pint Grandt is a 67 y.o. female     MRN : [id number]     DOB: 1946/06/23  Procedure Date: 04/08/2013  Nuclear Med Background Indication for Stress Test:  Evaluation for Ischemia,Abnormal EKG: Slightly worse anterior T-wave Inversion , and Post Hospital:03/21/13 Syncope episode>04/05/13 implant loop recorder  History: 08-2012 Echo: EF= 65-70%, mild AS, history of chemo Cardiac Risk Factors: History of Smoking  Symptoms: Chest Pressure and Chest Tightness without exertion, associated with home stress (last occurrence 03-19-13),  Dizziness, DOE, Light-Headedness, Nausea and Syncope   Nuclear Pre-Procedure Caffeine/Decaff Intake:  None NPO After: 8:00am   Lungs:  clear O2 Sat: 98% on room air. IV 0.9% NS with Angio Cath:  22g  IV Site: R Forearm  IV Started by:  Doyne Keel, CNMT  Chest Size (in):  44 Cup Size: D  Height: 5\' 5"  (1.651 m)  Weight:  256 lb (116.121 kg)  BMI:  Body mass index is 42.6 kg/(m^2). Tech Comments:  n/a    Nuclear Med Study 1 or 2 day study: 2 day  Stress Test Type:  Lexiscan  Reading MD: Cassell Clement, MD  Order Authorizing Provider:  Ripley Fraise  Resting Radionuclide: Technetium 29m Sestamibi  Resting Radionuclide Dose: 33.0 mCi on 04/09/13   Stress Radionuclide:  Technetium 25m Sestamibi  Stress Radionuclide Dose: 33.0 mCi on 04/08/13           Stress Protocol Rest HR: 67 Stress HR: 95  Rest BP: 110/65 Stress BP: 133/53  Exercise Time (min): n/a METS: n/a   Predicted Max HR: 152 bpm % Max HR: 62.5 bpm Rate Pressure Product: 21308   Dose of Adenosine (mg):  n/a Dose of Lexiscan: 0.4 mg  Dose of Atropine (mg): n/a Dose of Dobutamine: n/a mcg/kg/min (at max HR)  Stress Test Technologist: Irean Hong, RN  Nuclear Technologist:  Domenic Polite, CNMT     Rest Procedure:  Myocardial perfusion imaging was performed  at rest 45 minutes following the intravenous administration of Technetium 35m Sestamibi. Rest ECG: NSR with non-specific ST-T wave changes  Stress Procedure:  The patient received IV Lexiscan 0.4 mg over 15-seconds.  Technetium 70m Sestamibi injected at 30-seconds.  The patient denied chest pain. Quantitative spect images were obtained after a 45 minute delay. Stress ECG: No significant change from baseline ECG  QPS Raw Data Images:  Normal; no motion artifact; normal heart/lung ratio. Stress Images:  Normal homogeneous uptake in all areas of the myocardium. Rest Images:  Normal homogeneous uptake in all areas of the myocardium. Subtraction (SDS):  No evidence of ischemia. Transient Ischemic Dilatation (Normal <1.22):  0.98 Lung/Heart Ratio (Normal <0.45):  0.30  Quantitative Gated Spect Images QGS EDV:  68 ml QGS ESV:  16 ml  Impression Exercise Capacity:  Lexiscan with no exercise. BP Response:  Normal blood pressure response. Clinical Symptoms:  No significant symptoms noted. ECG Impression:  No significant ST segment change suggestive of ischemia. Comparison with Prior Nuclear Study: No previous nuclear study performed   Overall Impression:  Normal stress nuclear study.  LV Ejection Fraction: 77%.  LV Wall Motion:  NL LV Function; NL Wall Motion   [employer]

## 2013-04-09 ENCOUNTER — Ambulatory Visit (HOSPITAL_COMMUNITY): Payer: Medicare PPO | Attending: Cardiology

## 2013-04-09 DIAGNOSIS — R0989 Other specified symptoms and signs involving the circulatory and respiratory systems: Secondary | ICD-10-CM

## 2013-04-09 MED ORDER — TECHNETIUM TC 99M SESTAMIBI GENERIC - CARDIOLITE
33.0000 | Freq: Once | INTRAVENOUS | Status: AC | PRN
  Administered 2013-04-09: 33 via INTRAVENOUS

## 2013-04-19 ENCOUNTER — Other Ambulatory Visit: Payer: Self-pay | Admitting: Family Medicine

## 2013-05-06 ENCOUNTER — Telehealth: Payer: Self-pay | Admitting: Cardiology

## 2013-05-06 NOTE — Telephone Encounter (Signed)
New Problem:    Patient called in experiencing swelling in both her hands and feet with her right foot occasionally tingling.  Patient also stated that when she stretches her legs it feel like to skin is going to tear.  Please call back.

## 2013-05-06 NOTE — Telephone Encounter (Signed)
LMTCB

## 2013-05-06 NOTE — Telephone Encounter (Signed)
New Problem:    Patient called in returning your call. Please call back. 

## 2013-05-06 NOTE — Telephone Encounter (Signed)
Called patient and she reported swelling in hands and feet with some tingling for a week. She denies change in medications and salt intake. Discuss above with Dr. Elease Hashimoto (DOD) and he advised she follow up with PCP. Patient verbalized understanding.

## 2013-05-08 ENCOUNTER — Other Ambulatory Visit: Payer: Self-pay | Admitting: *Deleted

## 2013-05-08 ENCOUNTER — Other Ambulatory Visit: Payer: Self-pay | Admitting: Family Medicine

## 2013-05-08 ENCOUNTER — Encounter: Payer: Self-pay | Admitting: Family Medicine

## 2013-05-08 ENCOUNTER — Ambulatory Visit (INDEPENDENT_AMBULATORY_CARE_PROVIDER_SITE_OTHER): Payer: Medicare PPO

## 2013-05-08 ENCOUNTER — Ambulatory Visit (INDEPENDENT_AMBULATORY_CARE_PROVIDER_SITE_OTHER): Payer: Medicare PPO | Admitting: Family Medicine

## 2013-05-08 VITALS — BP 126/64 | HR 80 | Wt 260.0 lb

## 2013-05-08 DIAGNOSIS — R609 Edema, unspecified: Secondary | ICD-10-CM

## 2013-05-08 DIAGNOSIS — R0602 Shortness of breath: Secondary | ICD-10-CM

## 2013-05-08 DIAGNOSIS — Z1231 Encounter for screening mammogram for malignant neoplasm of breast: Secondary | ICD-10-CM

## 2013-05-08 LAB — COMPLETE METABOLIC PANEL WITH GFR
CO2: 27 mEq/L (ref 19–32)
Creat: 1.03 mg/dL (ref 0.50–1.10)
GFR, Est African American: 65 mL/min
GFR, Est Non African American: 56 mL/min — ABNORMAL LOW
Glucose, Bld: 105 mg/dL — ABNORMAL HIGH (ref 70–99)
Sodium: 141 mEq/L (ref 135–145)
Total Bilirubin: 0.3 mg/dL (ref 0.3–1.2)
Total Protein: 6.7 g/dL (ref 6.0–8.3)

## 2013-05-08 LAB — CBC WITH DIFFERENTIAL/PLATELET
Basophils Absolute: 0 10*3/uL (ref 0.0–0.1)
Basophils Relative: 0 % (ref 0–1)
Eosinophils Absolute: 0.2 10*3/uL (ref 0.0–0.7)
Lymphs Abs: 1.3 10*3/uL (ref 0.7–4.0)
MCH: 29.2 pg (ref 26.0–34.0)
Neutrophils Relative %: 60 % (ref 43–77)
Platelets: 193 10*3/uL (ref 150–400)
RBC: 3.8 MIL/uL — ABNORMAL LOW (ref 3.87–5.11)

## 2013-05-08 LAB — URINALYSIS
Hgb urine dipstick: NEGATIVE
Nitrite: NEGATIVE
Protein, ur: NEGATIVE mg/dL
Urobilinogen, UA: 0.2 mg/dL (ref 0.0–1.0)
pH: 6 (ref 5.0–8.0)

## 2013-05-08 MED ORDER — FLUOXETINE HCL 20 MG PO CAPS: 20.0000 mg | ORAL_CAPSULE | Freq: Three times a day (TID) | ORAL | Status: DC

## 2013-05-08 MED ORDER — PANTOPRAZOLE SODIUM 40 MG PO TBEC: 40.0000 mg | DELAYED_RELEASE_TABLET | Freq: Every day | ORAL | Status: AC

## 2013-05-08 MED ORDER — PANTOPRAZOLE SODIUM 40 MG PO TBEC: 40.0000 mg | DELAYED_RELEASE_TABLET | Freq: Every day | ORAL | Status: DC

## 2013-05-08 MED ORDER — FUROSEMIDE 20 MG PO TABS: 20.0000 mg | ORAL_TABLET | Freq: Every day | ORAL | Status: DC

## 2013-05-08 MED ORDER — TRAZODONE HCL 50 MG PO TABS: 50.0000 mg | ORAL_TABLET | Freq: Every evening | ORAL | Status: AC | PRN

## 2013-05-08 MED ORDER — TRAZODONE HCL 50 MG PO TABS: 50.0000 mg | ORAL_TABLET | Freq: Every evening | ORAL | Status: DC | PRN

## 2013-05-08 MED ORDER — POTASSIUM CHLORIDE ER 10 MEQ PO TBCR: 10.0000 meq | EXTENDED_RELEASE_TABLET | Freq: Every day | ORAL | Status: DC | PRN

## 2013-05-08 MED ORDER — IBUPROFEN 600 MG PO TABS: 600.0000 mg | ORAL_TABLET | Freq: Three times a day (TID) | ORAL | Status: DC | PRN

## 2013-05-08 NOTE — Patient Instructions (Addendum)
Take the lasix daily for 3 days. Weigh yourself each day. Stop the lasix after that and then only use if you need it.

## 2013-05-08 NOTE — Progress Notes (Signed)
Subjective:    Patient ID: [name], female    DOB: 1946/01/13, 67 y.o.   MRN: [id number]  HPI Edema in arms and legs x 2 weeks. Asked her to remove her rings. Has been more SOB. No CP.  No dietary changes. No increase in salt in her diet. No change in urination.  No abdominal pain.  Says thought the swelling started after standing on her feet for long periods. Will have to stop walking after about 50 feet bc of SOB. Says swelling is constant.  Says swelling is not better in the AM.  Only uses NSAIDS 1-2x a week.  She does have a history of endometrial carcinoma status post hysterectomy. She feels her weight is up about 9 pounds.   Review of Systems BP 126/64  Pulse 80  Wt 260 lb (117.935 kg)  BMI 43.27 kg/m2  SpO2 97%    No Known Allergies  Past Medical History  Diagnosis Date  . Back pain   . Diverticulosis April 2012  . Depression   . Uterine cancer 2011    endometrialca  . Hx of radiation therapy 03/08/11-04/18/11& 5/29,6/04/03/11/12    external beam and intracavity brachytherapy  . Anxiety   . Arthritis     degenerative  . Obesity   . Anemia     multifactorial,r/t chemo and radiation also  . History of mammogram 06/14/11    b/l mammogram  . Cataract   . Aortic stenosis     Past Surgical History  Procedure Laterality Date  . Portacath placement  Jan 2012  . Abdominal hysterectomy  2011  . Cataract extraction w/phaco Left 03/21/2013    Dr. Hardie Shackleton    History   Social History  . Marital Status: Married    Spouse Name: N/A    Number of Children: 1  . 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 Smoking status: Former Games developer  . Smokeless tobacco: Never Used  . Alcohol Use: No     Comment: Former alcoholic  . Drug Use: No  . Sexually Active: Not on file   Other Topics Concern  . Not on file   Social History Narrative  . No narrative on file    Family History  Problem Relation Age of Onset  .  Lung cancer Mother   . Pulmonary fibrosis Mother   . Heart attack Father   . Diabetes Father   . Colon cancer Neg Hx   . Rectal cancer Neg Hx   . Stomach cancer Neg Hx     Outpatient Encounter Prescriptions as of 05/08/2013  Medication Sig Dispense Refill  . Cholecalciferol (VITAMIN D3) 1000 UNITS CAPS Take 2,000 Units by mouth daily.        . Ferrous Gluconate (IRON) 240 (27 FE) MG TABS Take 1 tablet by mouth daily.      Marland Kitchen FLUoxetine (PROZAC) 20 MG capsule Take 1 capsule (20 mg total) by mouth 3 (three) times daily.  90 capsule  0  . ibuprofen (ADVIL,MOTRIN) 600 MG tablet Take 1 tablet (600 mg total) by mouth every 8 (eight) hours as needed for pain.  30 tablet  1  . Multiple Vitamin (MULTIVITAMIN) capsule Take 1 capsule by mouth daily.        . pantoprazole (PROTONIX) 40 MG tablet Take 1 tablet (40 mg total) by mouth daily.  30 tablet  3  . senna-docusate (SENOKOT-S) 8.6-50  MG per tablet Take 1 tablet by mouth daily as needed. For constipation      . traZODone (DESYREL) 50 MG tablet Take 1-3 tablets (50-150 mg total) by mouth at bedtime as needed for sleep.  90 tablet  0  . vitamin B-12 (CYANOCOBALAMIN) 1000 MCG tablet Take 1,000 mcg by mouth daily.        . [DISCONTINUED] FLUoxetine (PROZAC) 20 MG capsule Take 20 mg by mouth 3 (three) times daily.      . [DISCONTINUED] ibuprofen (ADVIL,MOTRIN) 600 MG tablet Take 1 tablet (600 mg total) by mouth every 8 (eight) hours as needed for pain.  15 tablet  0  . [DISCONTINUED] pantoprazole (PROTONIX) 40 MG tablet Take 40 mg by mouth daily.      . [DISCONTINUED] pantoprazole (PROTONIX) 40 MG tablet TAKE ONE TABLET BY MOUTH EVERY DAY  30 tablet  0  . [DISCONTINUED] traZODone (DESYREL) 50 MG tablet Take 50-150 mg by mouth at bedtime as needed for sleep.       . furosemide (LASIX) 20 MG tablet Take 1 tablet (20 mg total) by mouth daily.  20 tablet  0  . potassium chloride (K-DUR) 10 MEQ tablet Take 1 tablet (10 mEq total) by mouth daily as needed. Take  when you take the lasix  20 tablet  0  . [DISCONTINUED] Ondansetron HCl (ZOFRAN PO) Take 1 tablet by mouth every 8 (eight) hours as needed (nausea).        No facility-administered encounter medications on file as of 05/08/2013.          Objective:   Physical Exam  Constitutional: She is oriented to person, place, and time. She appears well-developed and well-nourished.  HENT:  Head: Normocephalic and atraumatic.  Eyes: Conjunctivae are normal. Pupils are equal, round, and reactive to light.  Neck: Neck supple. No thyromegaly present.  Cardiovascular: Normal rate, regular rhythm and normal heart sounds.   Pulmonary/Chest: Effort normal and breath sounds normal.  Musculoskeletal: She exhibits edema.  Trace ankle edema bilaterally.  Neurological: She is alert and oriented to person, place, and time.  Skin: Skin is warm and dry.  Psychiatric: She has a normal mood and affect. Her behavior is normal.          Assessment & Plan:  Edema- diffuse peripheral edema-it does not seem to improve much overnight with elevating her feet. Check CMP to evaluate kidney and renal function. We'll do urinalysis as well. We'll check her thyroid level. For now we'll go ahead and give her Lasix to take for a couple of days to see if she's able to diurese. Make sure eating a low salt diet and avoid drinking too much fluid.  SOB - she has noticed increased short of breath with the edema. We'll get a chest x-ray today to evaluate further for pulmonary edema. Lungs sound clear on exam today which is reassuring. Chest x-ray was negative for infection or for edema.

## 2013-05-11 LAB — URINE CULTURE: Colony Count: >=100000

## 2013-05-14 ENCOUNTER — Ambulatory Visit: Payer: Medicare PPO | Admitting: Sports Medicine

## 2013-05-14 ENCOUNTER — Other Ambulatory Visit: Payer: Self-pay | Admitting: *Deleted

## 2013-05-14 VITALS — BP 110/69 | HR 76 | Wt 260.0 lb

## 2013-05-14 DIAGNOSIS — R109 Unspecified abdominal pain: Secondary | ICD-10-CM

## 2013-05-14 DIAGNOSIS — R82998 Other abnormal findings in urine: Secondary | ICD-10-CM

## 2013-05-14 NOTE — Progress Notes (Signed)
Pt informed me that she had been taking the lasix for the first 3 days and hasn't taken anymore since then. She feels that her legs, ankles and feet are still swelling. I told her to continue to take the lasix along with the potassium, try to stay off of her feet and to elevate as much as she could and f/u on 6/25. She also informed me that she has been experiencing some back pain and has been taking [employer] Back Aid  1000 mg tablets Q12H it contains acetaminophen. She stated that this has help her with her back pain along with alternating heat and ice packs.Loralee Pacas Reeder

## 2013-05-14 NOTE — Assessment & Plan Note (Signed)
Repeat urinalysis/culture.

## 2013-05-14 NOTE — Progress Notes (Signed)
Noted need for repeat urinalysis due to previous one being contaminated.  I was present for all essential parts of this visit and procedure. Ihor Austin. Benjamin Stain, M.D.

## 2013-05-16 LAB — URINE CULTURE

## 2013-05-17 ENCOUNTER — Other Ambulatory Visit: Payer: Self-pay | Admitting: Physician Assistant

## 2013-05-17 MED ORDER — SULFAMETHOXAZOLE-TRIMETHOPRIM 800-160 MG PO TABS: 1.0000 | ORAL_TABLET | Freq: Two times a day (BID) | ORAL | Status: DC

## 2013-05-22 ENCOUNTER — Ambulatory Visit: Payer: Medicare PPO | Admitting: Family Medicine

## 2013-05-23 ENCOUNTER — Ambulatory Visit (INDEPENDENT_AMBULATORY_CARE_PROVIDER_SITE_OTHER): Payer: Medicare PPO | Admitting: Family Medicine

## 2013-05-23 ENCOUNTER — Encounter: Payer: Self-pay | Admitting: Family Medicine

## 2013-05-23 VITALS — BP 123/74 | HR 82 | Wt 257.0 lb

## 2013-05-23 DIAGNOSIS — N390 Urinary tract infection, site not specified: Secondary | ICD-10-CM

## 2013-05-23 DIAGNOSIS — Z5181 Encounter for therapeutic drug level monitoring: Secondary | ICD-10-CM

## 2013-05-23 DIAGNOSIS — M25473 Effusion, unspecified ankle: Secondary | ICD-10-CM

## 2013-05-23 DIAGNOSIS — S39012A Strain of muscle, fascia and tendon of lower back, initial encounter: Secondary | ICD-10-CM

## 2013-05-23 DIAGNOSIS — R609 Edema, unspecified: Secondary | ICD-10-CM

## 2013-05-23 DIAGNOSIS — S335XXA Sprain of ligaments of lumbar spine, initial encounter: Secondary | ICD-10-CM

## 2013-05-23 MED ORDER — POTASSIUM CHLORIDE ER 10 MEQ PO TBCR: 10.0000 meq | EXTENDED_RELEASE_TABLET | Freq: Every day | ORAL | Status: DC | PRN

## 2013-05-23 MED ORDER — FUROSEMIDE 20 MG PO TABS: 20.0000 mg | ORAL_TABLET | Freq: Every day | ORAL | Status: DC

## 2013-05-23 NOTE — Patient Instructions (Signed)
Complete the antibiotic.  Continue lasix daily and potassium daily Come back in one week and we'll recheck your urine to make sure that the infection has completely cleared.

## 2013-05-23 NOTE — Progress Notes (Signed)
  Subjective:    Patient ID: [name], female    DOB: Dec 25, 1945, 67 y.o.   MRN: [id number]  HPI UTI - tolertaint the ABX well. 2 more days left of ABX. She is feeling well.    LE edema -Using lasix every day and taking potassium with it.  No CP or SOB.   Low back pain - Started 2 weeks ago after lifting her husband.  She  Says she was actually bent over for about 2 days. Finally she has been able to stand up straight it is still very sore to touch and tender. She has been using some Advil it helps some. She did try one of her husbands muscle relaxers, Flexeril, she says it didn't do anything. She has been using a heating pad some and that helps temporarily. Overall she is about 50% better but she is still very sore and tender. No radiation of pain or numbness into her legs.Review of Systems     Objective:   Physical Exam  Constitutional: She is oriented to person, place, and time. She appears well-developed and well-nourished.  HENT:  Head: Normocephalic and atraumatic.  Cardiovascular: Normal rate, regular rhythm and normal heart sounds.   Pulmonary/Chest: Effort normal and breath sounds normal.  Musculoskeletal: She exhibits edema.  Trace edmea bilat to mid tibia. Lumbar spine with decreased range of motion. She is tender over both SI joints. Nontender over the lumbar spine itself. Normal gait.  Neurological: She is alert and oriented to person, place, and time.  Skin: Skin is warm and dry.  Psychiatric: She has a normal mood and affect. Her behavior is normal.          Assessment & Plan:  UTI - Improved. Repeat urine in one week after completes her ABX.    LE edema - well controlled on lasix and potassium. Recheck electrolytes and kidney function. Weight is down 5 lbs  on her home scale. Continue to weigh herself daily. If she notices an increase of 3 pounds or more she is to double her Lasix for a day and then recheck her weight the following day to make sure that it's back  down. We will continue daily Lasix as well as potassium. She does have a followup appointment with cardiology next week. Also recommended compression stockings that she's actually responding extremely well to the Lasix.  Shortness of breath-she complained of significant shortness of breath when I saw her before. Now that we have gotten some of the swelling down her shortness of breath has completely resolved. She says that she does not take the pill for one or 2 days over she will start to feel short of breath again.   Low back pain/strain  - Continue Advil, heaing pad. Will give her stretches to work on.  If not improving in 2-3 weeks then please let me know and we'll get x-rays of the lumbar spine consisted happen after trying to lift her husband and consider physical therapy. Handout given on exercises.

## 2013-05-24 LAB — BASIC METABOLIC PANEL WITH GFR
CO2: 28 mEq/L (ref 19–32)
Calcium: 9.1 mg/dL (ref 8.4–10.5)
Creat: 1.29 mg/dL — ABNORMAL HIGH (ref 0.50–1.10)
Glucose, Bld: 91 mg/dL (ref 70–99)

## 2013-05-27 ENCOUNTER — Encounter: Payer: Self-pay | Admitting: Cardiology

## 2013-05-27 ENCOUNTER — Ambulatory Visit (INDEPENDENT_AMBULATORY_CARE_PROVIDER_SITE_OTHER): Payer: Medicare PPO | Admitting: Cardiology

## 2013-05-27 ENCOUNTER — Ambulatory Visit (INDEPENDENT_AMBULATORY_CARE_PROVIDER_SITE_OTHER): Payer: Medicare PPO | Admitting: *Deleted

## 2013-05-27 VITALS — BP 138/73 | HR 81 | Wt 255.0 lb

## 2013-05-27 DIAGNOSIS — R55 Syncope and collapse: Secondary | ICD-10-CM

## 2013-05-27 DIAGNOSIS — I350 Nonrheumatic aortic (valve) stenosis: Secondary | ICD-10-CM

## 2013-05-27 DIAGNOSIS — I359 Nonrheumatic aortic valve disorder, unspecified: Secondary | ICD-10-CM

## 2013-05-27 DIAGNOSIS — R0600 Dyspnea, unspecified: Secondary | ICD-10-CM

## 2013-05-27 DIAGNOSIS — R0989 Other specified symptoms and signs involving the circulatory and respiratory systems: Secondary | ICD-10-CM

## 2013-05-27 LAB — PACEMAKER DEVICE OBSERVATION

## 2013-05-27 NOTE — Progress Notes (Signed)
HPI: Plesant female for fu of syncope. She previouslly had a CT scan of her abdomen and chest. The CT of her chest showed no metastatic disease. There was note of cardiomegaly and pulmonary artery enlargement suggestive of pulmonary hypertension. Echocardiogram in October of 2013 showed an ejection fraction of 65-70%, grade 2 diastolic dysfunction, mild biatrial enlargement and mild aortic stenosis. TR velocity was 2.5 m/s. Lower extremity Dopplers in November of 2013 showed no DVT. CardioNet performed for near syncope showed sinus rhythm. I last saw her in May 2014 following recurrent syncopal episode. Nuclear study in May of 2014 showed an ejection fraction of 77% and normal perfusion. DDimer 0.51. She had an implantable loop recorder. Since that time, she did have a period of increased pedal edema and dyspnea. Lasix was added and the symptoms have improved. She has not had chest pain. She occasionally has palpitations.   Current Outpatient Prescriptions  Medication Sig Dispense Refill  . Cholecalciferol (VITAMIN D3) 1000 UNITS CAPS Take 2,000 Units by mouth daily.        . Ferrous Gluconate (IRON) 240 (27 FE) MG TABS Take 1 tablet by mouth daily.      Marland Kitchen FLUoxetine (PROZAC) 20 MG capsule 3 tabs po qd      . furosemide (LASIX) 20 MG tablet Take 1 tablet (20 mg total) by mouth daily.  90 tablet  1  . ibuprofen (ADVIL,MOTRIN) 600 MG tablet Take 1 tablet (600 mg total) by mouth every 8 (eight) hours as needed for pain.  30 tablet  1  . Multiple Vitamin (MULTIVITAMIN) capsule Take 1 capsule by mouth daily.        . pantoprazole (PROTONIX) 40 MG tablet Take 1 tablet (40 mg total) by mouth daily.  30 tablet  3  . potassium chloride (K-DUR) 10 MEQ tablet Take 1 tablet (10 mEq total) by mouth daily as needed. Take when you take the lasix  90 tablet  0  . senna-docusate (SENOKOT-S) 8.6-50 MG per tablet Take 1 tablet by mouth daily as needed. For constipation      . sulfamethoxazole-trimethoprim (BACTRIM  DS,SEPTRA DS) 800-160 MG per tablet Take 1 tablet by mouth 2 (two) times daily.      . traZODone (DESYREL) 50 MG tablet Take 1-3 tablets (50-150 mg total) by mouth at bedtime as needed for sleep.  90 tablet  0  . vitamin B-12 (CYANOCOBALAMIN) 1000 MCG tablet Take 1,000 mcg by mouth daily.         No current facility-administered medications for this visit.     Past Medical History  Diagnosis Date  . Back pain   . Diverticulosis April 2012  . Depression   . Uterine cancer 2011    endometrialca  . Hx of radiation therapy 03/08/11-04/18/11& 5/29,6/04/03/11/12    external beam and intracavity brachytherapy  . Anxiety   . Arthritis     degenerative  . Obesity   . Anemia     multifactorial,r/t chemo and radiation also  . History of mammogram 06/14/11    b/l mammogram  . Cataract   . Aortic stenosis     Past Surgical History  Procedure Laterality Date  . Portacath placement  Jan 2012  . Abdominal hysterectomy  2011  . Cataract extraction w/phaco Left 03/21/2013    Dr. Hardie Shackleton    History   Social History  . Marital Status: Married    Spouse Name: N/A    Number of Children: 1  . Years of Education: N/A  Occupational History  .      Dietician   Social History Main Topics  . Smoking status: Former Games developer  . Smokeless tobacco: Never Used  . Alcohol Use: No     Comment: Former alcoholic  . Drug Use: No  . Sexually Active: Not on file   Other Topics Concern  . Not on file   Social History Narrative  . No narrative on file    ROS: no fevers or chills, productive cough, hemoptysis, dysphasia, odynophagia, melena, hematochezia, dysuria, hematuria, rash, seizure activity, orthopnea, PND, pedal edema, claudication. Remaining systems are negative.  Physical Exam: Well-developed obese in no acute distress.  Skin is warm and dry.  HEENT is normal.  Neck is supple.  Chest is clear to auscultation with normal expansion.  Cardiovascular exam is regular rate and rhythm. 2/6  systolic murmur left sternal border. Abdominal exam nontender or distended. No masses palpated. Extremities show no edema. neuro grossly intact

## 2013-05-27 NOTE — Assessment & Plan Note (Signed)
Still sounds mild on examination.plan followup echocardiogram in the future.

## 2013-05-27 NOTE — Assessment & Plan Note (Signed)
No further events. She will have her implantable loop recorder interrogated today.

## 2013-05-27 NOTE — Progress Notes (Signed)
Wound check ILR in office. 

## 2013-05-27 NOTE — Assessment & Plan Note (Signed)
Possible component of diastolic dysfunction. Continue Lasix.

## 2013-05-27 NOTE — Patient Instructions (Addendum)
Your physician wants you to follow-up in: 6 MONTHS WITH DR CRENSHAW You will receive a reminder letter in the mail two months in advance. If you don't receive a letter, please call our office to schedule the follow-up appointment.  

## 2013-06-04 ENCOUNTER — Ambulatory Visit (INDEPENDENT_AMBULATORY_CARE_PROVIDER_SITE_OTHER): Payer: Medicare PPO | Admitting: Family Medicine

## 2013-06-04 VITALS — BP 114/61 | HR 89 | Wt 259.0 lb

## 2013-06-04 DIAGNOSIS — R319 Hematuria, unspecified: Secondary | ICD-10-CM

## 2013-06-04 LAB — POCT URINALYSIS DIPSTICK
Bilirubin, UA: NEGATIVE
Ketones, UA: NEGATIVE
Spec Grav, UA: 1.015
pH, UA: 6

## 2013-06-04 NOTE — Progress Notes (Signed)
  Subjective:    Patient ID: [name], female    DOB: [birth year], 67 y.o.   MRN: [id number]  HPI Here for repeat UA to make sure UTI has cleared. Completed ABX.  She is now asymptomatic.    Review of Systems     Objective:   Physical Exam        Assessment & Plan:  UA with moderate leukocytes and trace blood. We'll send for microscopic evaluation and urine culture. Will call with results once available.   Nani Gasser, MD

## 2013-06-05 LAB — URINALYSIS, MICROSCOPIC ONLY: Casts: NONE SEEN

## 2013-06-05 LAB — URINALYSIS, ROUTINE W REFLEX MICROSCOPIC
Glucose, UA: NEGATIVE mg/dL
Nitrite: NEGATIVE
Specific Gravity, Urine: 1.013 (ref 1.005–1.030)
pH: 6 (ref 5.0–8.0)

## 2013-06-06 LAB — URINE CULTURE

## 2013-06-20 ENCOUNTER — Encounter: Payer: Self-pay | Admitting: Internal Medicine

## 2013-07-09 ENCOUNTER — Ambulatory Visit: Payer: Medicare PPO

## 2013-07-11 ENCOUNTER — Ambulatory Visit (INDEPENDENT_AMBULATORY_CARE_PROVIDER_SITE_OTHER): Payer: Medicare PPO

## 2013-07-11 DIAGNOSIS — Z1231 Encounter for screening mammogram for malignant neoplasm of breast: Secondary | ICD-10-CM

## 2013-07-12 ENCOUNTER — Encounter (HOSPITAL_COMMUNITY): Payer: Self-pay | Admitting: Emergency Medicine

## 2013-07-12 ENCOUNTER — Emergency Department (HOSPITAL_COMMUNITY): Payer: Medicare PPO

## 2013-07-12 ENCOUNTER — Inpatient Hospital Stay (HOSPITAL_COMMUNITY)
Admission: EM | Admit: 2013-07-12 | Discharge: 2013-07-15 | DRG: 287 | Disposition: A | Discharge status: Home / Self Care | Payer: Medicare PPO | Attending: Internal Medicine | Admitting: Internal Medicine

## 2013-07-12 DIAGNOSIS — F419 Anxiety disorder, unspecified: Secondary | ICD-10-CM

## 2013-07-12 DIAGNOSIS — Z6841 Body Mass Index (BMI) 40.0 and over, adult: Secondary | ICD-10-CM

## 2013-07-12 DIAGNOSIS — R079 Chest pain, unspecified: Secondary | ICD-10-CM

## 2013-07-12 DIAGNOSIS — Z923 Personal history of irradiation: Secondary | ICD-10-CM

## 2013-07-12 DIAGNOSIS — M1990 Unspecified osteoarthritis, unspecified site: Secondary | ICD-10-CM | POA: Diagnosis present

## 2013-07-12 DIAGNOSIS — F3289 Other specified depressive episodes: Secondary | ICD-10-CM | POA: Diagnosis present

## 2013-07-12 DIAGNOSIS — F329 Major depressive disorder, single episode, unspecified: Secondary | ICD-10-CM | POA: Diagnosis present

## 2013-07-12 DIAGNOSIS — R55 Syncope and collapse: Secondary | ICD-10-CM

## 2013-07-12 DIAGNOSIS — I200 Unstable angina: Secondary | ICD-10-CM

## 2013-07-12 DIAGNOSIS — E785 Hyperlipidemia, unspecified: Secondary | ICD-10-CM | POA: Diagnosis present

## 2013-07-12 DIAGNOSIS — Z79899 Other long term (current) drug therapy: Secondary | ICD-10-CM

## 2013-07-12 DIAGNOSIS — Z8542 Personal history of malignant neoplasm of other parts of uterus: Secondary | ICD-10-CM

## 2013-07-12 DIAGNOSIS — R0789 Other chest pain: Principal | ICD-10-CM | POA: Diagnosis present

## 2013-07-12 DIAGNOSIS — I359 Nonrheumatic aortic valve disorder, unspecified: Secondary | ICD-10-CM | POA: Diagnosis present

## 2013-07-12 DIAGNOSIS — I350 Nonrheumatic aortic (valve) stenosis: Secondary | ICD-10-CM

## 2013-07-12 DIAGNOSIS — I10 Essential (primary) hypertension: Secondary | ICD-10-CM | POA: Diagnosis present

## 2013-07-12 HISTORY — DX: Gastro-esophageal reflux disease without esophagitis: K21.9

## 2013-07-12 HISTORY — DX: Abnormal electrocardiogram (ECG) (EKG): R94.31

## 2013-07-12 HISTORY — DX: Syncope and collapse: R55

## 2013-07-12 HISTORY — DX: Chest pain, unspecified: R07.9

## 2013-07-12 HISTORY — DX: Cardiac murmur, unspecified: R01.1

## 2013-07-12 HISTORY — DX: Personal history of other medical treatment: Z92.89

## 2013-07-12 HISTORY — DX: Nonrheumatic aortic (valve) stenosis: I35.0

## 2013-07-12 LAB — POCT I-STAT TROPONIN I: Troponin i, poc: 0 ng/mL (ref 0.00–0.08)

## 2013-07-12 LAB — CBC WITH DIFFERENTIAL/PLATELET
Basophils Relative: 0 % (ref 0–1)
Eosinophils Relative: 2 % (ref 0–5)
Hemoglobin: 11.8 g/dL — ABNORMAL LOW (ref 12.0–15.0)
Lymphs Abs: 1.3 10*3/uL (ref 0.7–4.0)
MCH: 30.4 pg (ref 26.0–34.0)
MCV: 88.9 fL (ref 78.0–100.0)
Monocytes Absolute: 0.6 10*3/uL (ref 0.1–1.0)
Monocytes Relative: 8 % (ref 3–12)
Neutrophils Relative %: 71 % (ref 43–77)
Platelets: 208 10*3/uL (ref 150–400)
RBC: 3.88 MIL/uL (ref 3.87–5.11)
WBC: 7.1 10*3/uL (ref 4.0–10.5)

## 2013-07-12 LAB — COMPREHENSIVE METABOLIC PANEL
ALT: 10 U/L (ref 0–35)
AST: 13 U/L (ref 0–37)
Alkaline Phosphatase: 81 U/L (ref 39–117)
CO2: 22 mEq/L (ref 19–32)
Calcium: 9.2 mg/dL (ref 8.4–10.5)
Chloride: 100 mEq/L (ref 96–112)
GFR calc Af Amer: 66 mL/min — ABNORMAL LOW (ref >90)
GFR calc non Af Amer: 57 mL/min — ABNORMAL LOW (ref >90)
Glucose, Bld: 95 mg/dL (ref 70–99)
Sodium: 138 mEq/L (ref 135–145)
Total Bilirubin: 0.3 mg/dL (ref 0.3–1.2)

## 2013-07-12 LAB — MRSA PCR SCREENING: MRSA by PCR: NEGATIVE

## 2013-07-12 MED ORDER — VITAMIN D 1000 UNITS PO TABS
2000.0000 [IU] | ORAL_TABLET | Freq: Every day | ORAL | Status: DC
  Administered 2013-07-13 – 2013-07-14 (×2): 2000 [IU] via ORAL
  Filled 2013-07-12 (×4): qty 2

## 2013-07-12 MED ORDER — NITROGLYCERIN 0.4 MG SL SUBL
0.4000 mg | SUBLINGUAL_TABLET | SUBLINGUAL | Status: DC | PRN
  Administered 2013-07-12 (×2): 0.4 mg via SUBLINGUAL
  Filled 2013-07-12: qty 25

## 2013-07-12 MED ORDER — SODIUM CHLORIDE 0.9 % IJ SOLN: 3.0000 mL | INTRAMUSCULAR | Status: DC | PRN

## 2013-07-12 MED ORDER — IRON 240 (27 FE) MG PO TABS: 1.0000 | ORAL_TABLET | Freq: Every day | ORAL | Status: DC

## 2013-07-12 MED ORDER — MORPHINE SULFATE 2 MG/ML IJ SOLN
2.0000 mg | INTRAMUSCULAR | Status: DC | PRN
  Administered 2013-07-14: 2 mg via INTRAVENOUS
  Filled 2013-07-12: qty 1

## 2013-07-12 MED ORDER — ONDANSETRON HCL 4 MG/2ML IJ SOLN: 4.0000 mg | Freq: Four times a day (QID) | INTRAMUSCULAR | Status: DC | PRN

## 2013-07-12 MED ORDER — ASPIRIN EC 81 MG PO TBEC
81.0000 mg | DELAYED_RELEASE_TABLET | Freq: Every day | ORAL | Status: DC
  Administered 2013-07-13 – 2013-07-14 (×2): 81 mg via ORAL
  Filled 2013-07-12 (×3): qty 1

## 2013-07-12 MED ORDER — ALPRAZOLAM 0.25 MG PO TABS
0.2500 mg | ORAL_TABLET | Freq: Three times a day (TID) | ORAL | Status: DC | PRN
  Administered 2013-07-12 – 2013-07-14 (×2): 0.25 mg via ORAL
  Filled 2013-07-12 (×2): qty 1

## 2013-07-12 MED ORDER — NITROGLYCERIN 2 % TD OINT
0.5000 [in_us] | TOPICAL_OINTMENT | Freq: Four times a day (QID) | TRANSDERMAL | Status: DC
  Administered 2013-07-12 – 2013-07-14 (×8): 0.5 [in_us] via TOPICAL
  Filled 2013-07-12: qty 30

## 2013-07-12 MED ORDER — ACETAMINOPHEN 325 MG PO TABS
650.0000 mg | ORAL_TABLET | ORAL | Status: DC | PRN
  Administered 2013-07-13 – 2013-07-15 (×5): 650 mg via ORAL
  Filled 2013-07-12 (×5): qty 2

## 2013-07-12 MED ORDER — FERROUS SULFATE 325 (65 FE) MG PO TABS
325.0000 mg | ORAL_TABLET | Freq: Every day | ORAL | Status: DC
  Administered 2013-07-13 – 2013-07-14 (×2): 325 mg via ORAL
  Filled 2013-07-12 (×4): qty 1

## 2013-07-12 MED ORDER — PANTOPRAZOLE SODIUM 40 MG PO TBEC
40.0000 mg | DELAYED_RELEASE_TABLET | Freq: Every day | ORAL | Status: DC
  Administered 2013-07-13 – 2013-07-14 (×2): 40 mg via ORAL
  Filled 2013-07-12 (×2): qty 1

## 2013-07-12 MED ORDER — MULTIVITAMINS PO CAPS
1.0000 | ORAL_CAPSULE | Freq: Every day | ORAL | Status: DC
Start: 2013-07-12 — End: 2013-07-12
  Filled 2013-07-12: qty 1

## 2013-07-12 MED ORDER — ADULT MULTIVITAMIN W/MINERALS CH
1.0000 | ORAL_TABLET | Freq: Every day | ORAL | Status: DC
  Administered 2013-07-13 – 2013-07-14 (×2): 1 via ORAL
  Filled 2013-07-12 (×4): qty 1

## 2013-07-12 MED ORDER — FLUOXETINE HCL 20 MG PO CAPS
60.0000 mg | ORAL_CAPSULE | Freq: Every day | ORAL | Status: DC
  Administered 2013-07-12: 20 mg via ORAL
  Filled 2013-07-12 (×2): qty 3

## 2013-07-12 MED ORDER — SODIUM CHLORIDE 0.9 % IJ SOLN
3.0000 mL | Freq: Two times a day (BID) | INTRAMUSCULAR | Status: DC
  Administered 2013-07-14: 3 mL via INTRAVENOUS

## 2013-07-12 MED ORDER — ATORVASTATIN CALCIUM 80 MG PO TABS
80.0000 mg | ORAL_TABLET | Freq: Every day | ORAL | Status: DC
  Administered 2013-07-13 – 2013-07-14 (×2): 80 mg via ORAL
  Filled 2013-07-12 (×3): qty 1

## 2013-07-12 MED ORDER — ALBUTEROL SULFATE (5 MG/ML) 0.5% IN NEBU
5.0000 mg | INHALATION_SOLUTION | Freq: Once | RESPIRATORY_TRACT | Status: AC
  Administered 2013-07-12: 5 mg via RESPIRATORY_TRACT
  Filled 2013-07-12: qty 1

## 2013-07-12 MED ORDER — SODIUM CHLORIDE 0.9 % IV SOLN
INTRAVENOUS | Status: DC
  Administered 2013-07-15: 04:00:00 via INTRAVENOUS

## 2013-07-12 MED ORDER — DIAZEPAM 5 MG/ML IJ SOLN
INTRAMUSCULAR | Status: AC
  Filled 2013-07-12: qty 2

## 2013-07-12 MED ORDER — VITAMIN B-12 1000 MCG PO TABS
1000.0000 ug | ORAL_TABLET | Freq: Every day | ORAL | Status: DC
  Administered 2013-07-13 – 2013-07-14 (×2): 1000 ug via ORAL
  Filled 2013-07-12 (×4): qty 1

## 2013-07-12 MED ORDER — ASPIRIN 81 MG PO CHEW
324.0000 mg | CHEWABLE_TABLET | ORAL | Status: AC
  Administered 2013-07-15: 324 mg via ORAL
  Filled 2013-07-12: qty 4

## 2013-07-12 MED ORDER — HEPARIN (PORCINE) IN NACL 100-0.45 UNIT/ML-% IJ SOLN
1450.0000 [IU]/h | INTRAMUSCULAR | Status: DC
  Administered 2013-07-12: 1000 [IU]/h via INTRAVENOUS
  Administered 2013-07-13 – 2013-07-14 (×3): 1450 [IU]/h via INTRAVENOUS
  Filled 2013-07-12 (×6): qty 250

## 2013-07-12 MED ORDER — SODIUM CHLORIDE 0.9 % IV SOLN
Freq: Once | INTRAVENOUS | Status: AC
  Administered 2013-07-12: 14:00:00 via INTRAVENOUS

## 2013-07-12 MED ORDER — TRAZODONE HCL 50 MG PO TABS
50.0000 mg | ORAL_TABLET | Freq: Every evening | ORAL | Status: DC | PRN
  Filled 2013-07-12: qty 3

## 2013-07-12 MED ORDER — SODIUM CHLORIDE 0.9 % IV SOLN
250.0000 mL | INTRAVENOUS | Status: DC | PRN
  Administered 2013-07-12 – 2013-07-13 (×2): 250 mL via INTRAVENOUS

## 2013-07-12 MED ORDER — HEPARIN BOLUS VIA INFUSION
4000.0000 [IU] | Freq: Once | INTRAVENOUS | Status: AC
  Administered 2013-07-12: 4000 [IU] via INTRAVENOUS
  Filled 2013-07-12: qty 4000

## 2013-07-12 MED ORDER — DIAZEPAM 5 MG/ML IJ SOLN
2.5000 mg | Freq: Once | INTRAMUSCULAR | Status: AC
  Administered 2013-07-12: 2.5 mg via INTRAVENOUS

## 2013-07-12 NOTE — ED Provider Notes (Signed)
CSN: [id number]     Arrival date & time 07/12/13  1319 History     First MD Initiated Contact with Patient 07/12/13 1341     Chief Complaint  Patient presents with  . Chest Pain   (Consider location/radiation/quality/duration/timing/severity/associated sxs/prior Treatment) HPI Patient is a 67 year old female who presents to the emergency department complaining of chest pain. Patient states her chest pain began about 2 hours ago. Patient states it feels like pressure in the center of her chest radiating into the back. Patient denies any associated symptoms such as shortness of breath, dizziness, diaphoresis, nausea. Patient states when her symptoms began she tried to sit down and rest after her symptoms did not improve she took an aspirin and called EMS. Patient received additional 281 mg of aspirin by EMS and one nitroglycerin which improved her symptoms. Patient denies any history of coronary artery disease. Patient states she does have history of aortic stenosis. She has recently had evaluation for syncopal episode and currently has an implanted event recorder. Patient denies any current dizziness or syncopal episodes. Patient denies any similar symptoms in the past. Patient denies any weakness or numbness in extremities. Patient denies any history of any lung problems. Patient denies any recent history of travel, surgeries, denies any pain in her legs. Patient does states that she feels like her legs are more swollen than usual. Past Medical History  Diagnosis Date  . Back pain   . Diverticulosis April 2012  . Depression   . Uterine cancer 2011    endometrialca  . Hx of radiation therapy 03/08/11-04/18/11& 5/29,6/04/03/11/12    external beam and intracavity brachytherapy  . Anxiety   . Arthritis     degenerative  . Obesity   . Anemia     multifactorial,r/t chemo and radiation also  . History of mammogram 06/14/11    b/l mammogram  . Cataract   . Aortic stenosis    Past Surgical History   Procedure Laterality Date  . Portacath placement  Jan 2012  . Abdominal hysterectomy  2011  . Cataract extraction w/phaco Left 03/21/2013    Dr. Hardie Shackleton   Family History  Problem Relation Age of Onset  . Lung cancer Mother   . Pulmonary fibrosis Mother   . Heart attack Father   . Diabetes Father   . Colon cancer Neg Hx   . Rectal cancer Neg Hx   . Stomach cancer Neg Hx    History  Substance Use Topics  . Smoking status: Former Games developer  . Smokeless tobacco: Never Used  . Alcohol Use: No     Comment: Former alcoholic   OB History   Grav Para Term Preterm Abortions TAB SAB Ect Mult Living                 Review of Systems  Unable to perform ROS Constitutional: Negative for fever and chills.  HENT: Negative for neck pain and neck stiffness.   Respiratory: Positive for chest tightness. Negative for cough, shortness of breath and wheezing.   Cardiovascular: Positive for chest pain and leg swelling. Negative for palpitations.  Gastrointestinal: Negative.   Genitourinary: Negative for dysuria and flank pain.  Musculoskeletal: Negative.   Skin: Negative.   Neurological: Negative for dizziness, weakness, numbness and headaches.  All other systems reviewed and are negative.    Allergies  Review of patient's allergies indicates no known allergies.  Home Medications   Current Outpatient Rx  Name  Route  Sig  Dispense  Refill  .  Cholecalciferol (VITAMIN D3) 1000 UNITS CAPS   Oral   Take 2,000 Units by mouth daily.           . Ferrous Gluconate (IRON) 240 (27 FE) MG TABS   Oral   Take 1 tablet by mouth daily.         Marland Kitchen FLUoxetine (PROZAC) 20 MG capsule      3 tabs po qd         . furosemide (LASIX) 20 MG tablet   Oral   Take 1 tablet (20 mg total) by mouth daily.   90 tablet   1   . ibuprofen (ADVIL,MOTRIN) 600 MG tablet   Oral   Take 1 tablet (600 mg total) by mouth every 8 (eight) hours as needed for pain.   30 tablet   1   . Multiple Vitamin  (MULTIVITAMIN) capsule   Oral   Take 1 capsule by mouth daily.           . pantoprazole (PROTONIX) 40 MG tablet   Oral   Take 1 tablet (40 mg total) by mouth daily.   30 tablet   3   . potassium chloride (K-DUR) 10 MEQ tablet   Oral   Take 1 tablet (10 mEq total) by mouth daily as needed. Take when you take the lasix   90 tablet   0   . senna-docusate (SENOKOT-S) 8.6-50 MG per tablet   Oral   Take 1 tablet by mouth daily as needed. For constipation         . traZODone (DESYREL) 50 MG tablet   Oral   Take 1-3 tablets (50-150 mg total) by mouth at bedtime as needed for sleep.   90 tablet   0   . vitamin B-12 (CYANOCOBALAMIN) 1000 MCG tablet   Oral   Take 1,000 mcg by mouth daily.            BP 101/55  Pulse 68  Temp(Src) 98.5 F (36.9 C) (Oral)  Resp 26  SpO2 100% Physical Exam  Nursing note and vitals reviewed. Constitutional: She is oriented to person, place, and time. She appears well-developed and well-nourished.  Appears anxious  HENT:  Head: Normocephalic.  Eyes: Conjunctivae are normal.  Neck: Neck supple.  Cardiovascular: Normal rate, regular rhythm and normal heart sounds.   Pulmonary/Chest: Effort normal and breath sounds normal. No respiratory distress. She has no wheezes. She has no rales.  Abdominal: Soft. Bowel sounds are normal. She exhibits no distension. There is no tenderness. There is no rebound.  Musculoskeletal:  Trace lower extremity edema bilaterally. Dorsal pedal pulses are equal and intact bilaterally  Neurological: She is alert and oriented to person, place, and time. No cranial nerve deficit.  Skin: Skin is warm and dry.  Psychiatric: She has a normal mood and affect. Her behavior is normal.    ED Course   Procedures (including critical care time)   Date: 07/12/2013  Rate: 69  Rhythm: normal sinus rhythm  QRS Axis: normal  Intervals: normal  ST/T Wave abnormalities: nonspecific T wave changes  Conduction  Disutrbances:none  Narrative Interpretation:   Old EKG Reviewed: unchanged  Results for orders placed during the hospital encounter of 07/12/13  CBC WITH DIFFERENTIAL      Result Value Range   WBC 7.1  4.0 - 10.5 K/uL   RBC 3.88  3.87 - 5.11 MIL/uL   Hemoglobin 11.8 (*) 12.0 - 15.0 g/dL   HCT 13.0 (*) 86.5 - 78.4 %  MCV 88.9  78.0 - 100.0 fL   MCH 30.4  26.0 - 34.0 pg   MCHC 34.2  30.0 - 36.0 g/dL   RDW 82.9 (*) 56.2 - 13.0 %   Platelets 208  150 - 400 K/uL   Neutrophils Relative % 71  43 - 77 %   Lymphocytes Relative 19  12 - 46 %   Monocytes Relative 8  3 - 12 %   Eosinophils Relative 2  0 - 5 %   Basophils Relative 0  0 - 1 %   Neutro Abs 5.1  1.7 - 7.7 K/uL   Lymphs Abs 1.3  0.7 - 4.0 K/uL   Monocytes Absolute 0.6  0.1 - 1.0 K/uL   Eosinophils Absolute 0.1  0.0 - 0.7 K/uL   Basophils Absolute 0.0  0.0 - 0.1 K/uL   Smear Review MORPHOLOGY UNREMARKABLE    COMPREHENSIVE METABOLIC PANEL      Result Value Range   Sodium 138  135 - 145 mEq/L   Potassium 3.7  3.5 - 5.1 mEq/L   Chloride 100  96 - 112 mEq/L   CO2 22  19 - 32 mEq/L   Glucose, Bld 95  70 - 99 mg/dL   BUN 20  6 - 23 mg/dL   Creatinine, Ser 8.65  0.50 - 1.10 mg/dL   Calcium 9.2  8.4 - 78.4 mg/dL   Total Protein 6.8  6.0 - 8.3 g/dL   Albumin 3.5  3.5 - 5.2 g/dL   AST 13  0 - 37 U/L   ALT 10  0 - 35 U/L   Alkaline Phosphatase 81  39 - 117 U/L   Total Bilirubin 0.3  0.3 - 1.2 mg/dL   GFR calc non Af Amer 57 (*) >90 mL/min   GFR calc Af Amer 66 (*) >90 mL/min  PRO B NATRIURETIC PEPTIDE      Result Value Range   Pro B Natriuretic peptide (BNP) 157.1 (*) 0 - 125 pg/mL  POCT I-STAT TROPONIN I      Result Value Range   Troponin i, poc 0.00  0.00 - 0.08 ng/mL   Comment 3            Dg Chest 2 View  07/12/2013   CLINICAL DATA:  Chest pain. Shortness of breath.  EXAM: CHEST  2 VIEW  COMPARISON:  05/08/2013.  FINDINGS: Low lung volumes are present, causing crowding of the pulmonary vasculature. Loop recorder noted.  Mild cardiomegaly.  Airway thickening is present, suggesting bronchitis or reactive airways disease. Thoracic spondylosis noted.  IMPRESSION: 1. Airway thickening is present, suggesting bronchitis or reactive airways disease. 2. Mild cardiomegaly, without edema. 3. Low lung volumes.   Electronically Signed   By: Herbie Baltimore   On: 07/12/2013 14:34     No results found.  1. Chest pain     MDM  Patient emergency department complaining of chest pain onset today. He described as pressure-like. Patient received aspirin and nitroglycerin prior to arrival which has helped her symptoms. Patient does not have known coronary disease and reports having a chemical stress test several months ago which was negative. Patient currently been evaluated for a syncopal episode and has a record monitor. Patient's EKG today in the emergency department is unchanged. Her first set of troponin and lab work is unremarkable. Patient's chest x-rays consistent with possible bronchitis. The patient received 2 more sublingual nitroglycerin in ER which has resolved her chest pain completely. patient states she no longer  has any chest pain. I have called [facility] who patient is followed by, and they will come by and see patient emergency department.  While in emergency department the patient developed severe cramp in her left calf and left ankle. Stretched her calf and her ankle and gave her 2.5 mg of Valium which has relieved her symptoms. Patient is currently pain free. She is neurovascularly intact. She isn't maintaining oxygen saturation at 100%. she is not tachycardic or tachypneic. Doubt PE or dissection.  Filed Vitals:   07/12/13 1350 07/12/13 1400 07/12/13 1445 07/12/13 1500  BP: 101/55 100/67 93/53 125/60  Pulse: 68 65    Temp: 98.5 F (36.9 C)     TempSrc: Oral     Resp: 26 14  20   SpO2: 100% 100%        Lottie Mussel, PA-C 07/12/13 1628  Myriam Jacobson Stokely Jeancharles, PA-C 07/12/13 1636

## 2013-07-12 NOTE — ED Notes (Signed)
Pt Son is POA: Phone number [phone] (work) Daphine Deutscher. [phone] (cell)

## 2013-07-12 NOTE — ED Provider Notes (Signed)
If cardiology refuse admission, consult medicine for admission for chest pain rule out / bronchitis.   Fayrene Helper, PA-C 07/12/13 1649

## 2013-07-12 NOTE — ED Notes (Signed)
Sudden onset of chest tightness and shortness of breath. Pt received 1 nitro and 281mg  aspirin prior to arrival. Before nitro pt states pain was also between shoulder blades but now is in center of chest.

## 2013-07-12 NOTE — ED Notes (Signed)
PA at bedside.

## 2013-07-12 NOTE — H&P (Signed)
Patient ID: [name] MRN: [id number], DOB/AGE: 11-28-1946   Admit date: 07/12/2013  Primary Physician: Nani Gasser, MD Primary Cardiologist: B. Jens Som, MD   Pt. Profile:  67 y/o female with h/o syncope who presented to the ED today with chest pain.  Problem List  Past Medical History  Diagnosis Date  . Back pain   . Diverticulosis April 2012  . Depression   . Uterine cancer 2011    endometrialca  . Hx of radiation therapy 03/08/11-04/18/11& 5/29,6/04/03/11/12    external beam and intracavity brachytherapy  . Anxiety   . Arthritis     degenerative  . Obesity   . Anemia     multifactorial,r/t chemo and radiation also  . History of mammogram 06/14/11    b/l mammogram  . Cataract   . Mild aortic stenosis     a. 08/2012 Echo: EF 65-70%, Gr 2 DD, mild biatrial enlargement, mild AS.  Marland Kitchen Abnormal ECG     a. 03/2013 cardiolite: EF 77%, normal perfusion.  . Syncope     a. cardionet showed sinus rhythm in setting of near syncope;  b. 03/2013: s/p ILR    Past Surgical History  Procedure Laterality Date  . Portacath placement  Jan 2012  . Abdominal hysterectomy  2011  . Cataract extraction w/phaco Left 03/21/2013    Dr. Hardie Shackleton    Allergies  No Known Allergies  HPI  67 y/o female with h/o recurrent syncope and abnl ECG.  Previous cardionet failed to show any arrhythmias during a spell of presyncope and as a result, an implantable loop was placed in May.  Prior to loop placement, she underwent cardiolite stress testing, which was non-ischemic and showed normal LV function.  Since loop placement, she has not had any recurrent presyncope or syncope.  She lives in [place] w/ her disabled husband, who requires quite a bit of her attention and assistance.  She does not routinely exercise but is able to perform usual house chores and grocery shopping w/o chest pain or dyspnea.   She was in her USOH until this afternoon, just prior to lunch, when she was sitting and had  sudden onset of substernal chest pressure and squeezing associated with dyspnea.  She walked across the church to the preacher's office and noted worsening of Ss with exertion.  While sitting again, she noted that the discomfort moved into her bilat subscapular area.  A co-worker gave her an asa and then EMS was called.  Upon their arrival, she was given additional ASA along with SL ntg w/o immediate relief.  ECG showed ant twi, which is old.  She was taken to [facility] ED where she was treated with additional NTG x 3, and over the past 5 hrs, pain has slowly eased up.  Whereas pain was a 9/10 at onset, she currently rates it as a 2/10.  Initial troponin is nl.  CXR showed airway thickening w/ ? Of bronchitis/reactive airway dzs.  She just finished an albuterol neb and lungs are clear.  Home Medications  Prior to Admission medications   Medication Sig Start Date End Date Taking? Authorizing Provider  Cholecalciferol (VITAMIN D3) 1000 UNITS CAPS Take 2,000 Units by mouth daily.     Yes Historical Provider, MD  Ferrous Gluconate (IRON) 240 (27 FE) MG TABS Take 1 tablet by mouth daily.   Yes Historical Provider, MD  FLUoxetine (PROZAC) 20 MG capsule Take 60 mg by mouth daily.  05/08/13  Yes Agapito Games, MD  furosemide (LASIX) 20 MG tablet Take 1 tablet (20 mg total) by mouth daily. 05/23/13  Yes Agapito Games, MD  ibuprofen (ADVIL,MOTRIN) 600 MG tablet Take 1 tablet (600 mg total) by mouth every 8 (eight) hours as needed for pain. 05/08/13  Yes Agapito Games, MD  Multiple Vitamin (MULTIVITAMIN) capsule Take 1 capsule by mouth daily.     Yes Historical Provider, MD  pantoprazole (PROTONIX) 40 MG tablet Take 1 tablet (40 mg total) by mouth daily. 05/08/13  Yes Agapito Games, MD  potassium chloride (K-DUR) 10 MEQ tablet Take 1 tablet (10 mEq total) by mouth daily as needed. Take when you take the lasix 05/23/13  Yes Agapito Games, MD  senna-docusate (SENOKOT-S) 8.6-50 MG per  tablet Take 1 tablet by mouth daily as needed. For constipation   Yes Historical Provider, MD  traZODone (DESYREL) 50 MG tablet Take 1-3 tablets (50-150 mg total) by mouth at bedtime as needed for sleep. 05/08/13  Yes Agapito Games, MD  vitamin B-12 (CYANOCOBALAMIN) 1000 MCG tablet Take 1,000 mcg by mouth daily.     Yes Historical Provider, MD   Family History  Family History  Problem Relation Age of Onset  . Lung cancer Mother   . Pulmonary fibrosis Mother     she thinks - she was adopted and is unsure of FH  . Heart attack Father     she thinks - she was adopted and is unsure of FH  . Diabetes Father   . Colon cancer Neg Hx   . Rectal cancer Neg Hx   . Stomach cancer Neg Hx    Social History  History   Social History  . Marital Status: Married    Spouse Name: N/A    Number of Children: 1  . 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 Smoking status: Former Games developer  . Smokeless tobacco: Never Used     Comment: quit smoking 40+ yrs ago.  . Alcohol Use: No     Comment: Former alcoholic  . Drug Use: No  . Sexual Activity: Not on file   Other Topics Concern  . Not on file   Social History Narrative   Lives in [place] with husband, who is disabled.  She does not routinely exercise.  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    Review of Systems General:  No chills, fever, night sweats or weight changes.  Cardiovascular:  +++ chest pain and dyspnea on exertion, no edema, orthopnea, palpitations, paroxysmal nocturnal dyspnea. Dermatological: No rash, lesions/masses Respiratory: No cough, +++ dyspnea Urologic: No hematuria, dysuria Abdominal:   No nausea, vomiting, diarrhea, bright red blood per rectum, melena, or hematemesis Neurologic:  No visual changes, wkns, changes in mental status. All other systems reviewed and are otherwise negative except as noted above.  Physical Exam  Blood  pressure 105/52, pulse 62, temperature 97.7 F (36.5 C), temperature source Oral, resp. rate 15, SpO2 99.00%.  General: Pleasant, NAD Psych: Normal affect. Neuro: Alert and oriented X 3. Moves all extremities spontaneously. HEENT: Normal  Neck: Supple without bruits or JVD. Lungs:  Resp regular and unlabored, CTA. Heart: RRR no s3, s4, 2/6 sem throughout. Abdomen: Soft, non-tender, non-distended, BS + x 4.  Extremities: No clubbing, cyanosis or edema. DP/PT/Radials 2+ and equal bilaterally.  Labs  Trop i, poc: 0.00  Lab Results  Component  Value Date   WBC 7.1 07/12/2013   HGB 11.8* 07/12/2013   HCT 34.5* 07/12/2013   MCV 88.9 07/12/2013   PLT 208 07/12/2013     Recent Labs Lab 07/12/13 1508  NA 138  K 3.7  CL 100  CO2 22  BUN 20  CREATININE 0.99  CALCIUM 9.2  PROT 6.8  BILITOT 0.3  ALKPHOS 81  ALT 10  AST 13  GLUCOSE 95   adiology/Studies  Dg Chest 2 View  07/12/2013   CLINICAL DATA:  Chest pain. Shortness of breath.  EXAM: CHEST  2 VIEW  COMPARISON:  05/08/2013.  FINDINGS: Low lung volumes are present, causing crowding of the pulmonary vasculature. Loop recorder noted. Mild cardiomegaly.  Airway thickening is present, suggesting bronchitis or reactive airways disease. Thoracic spondylosis noted.  IMPRESSION: 1. Airway thickening is present, suggesting bronchitis or reactive airways disease. 2. Mild cardiomegaly, without edema. 3. Low lung volumes.   Electronically Signed   By: Herbie Baltimore   On: 07/12/2013 14:34   ECG  Rsr, 75, anterior st dep/twi - not significantly changed from older tracings.  ASSESSMENT AND PLAN  1.  [place]:  Pt presents with chest pressure and squeezing associated with dyspnea that occurred @ rest today.  She did not get immediate relief from sl ntg and continues to have mild discomfort now 5 hrs after onset.  Initial troponin is nl.  ECG is abnl with ant st dep/twi, though this is not acute changed.  Plan to admit, cycle CE, heparinize, add  asa/statin.  No bb with relative bradycardia and soft BP's @ baseline.  She had a negative cardiolite in May.  Will plan diagnostic catheterization on Monday.  She'd like to discuss this with her son, who lives in [place] and is her medical POA.  2.  H/O Syncope:  S/p ILR.  No recurrent presyncope or syncope since placement.  3. Morbid obesity, BMI 43  Signed, Nicolasa Ducking, NP 07/12/2013, 5:06 PM  Patient seen and examined independently. Gilford Raid, NP note reviewed carefully - agree with his assessment and plan. I have edited the note based on my findings. Symptoms concerning for [place]. ECG mildly abnormal. CE negative so far. Will admit. Treat with heparin, ASA, NTG, statin. HR and BP too low for b-blocker.  Plan cath Monday.   Truman Hayward 5:48 PM

## 2013-07-12 NOTE — ED Provider Notes (Signed)
Medical screening examination/treatment/procedure(s) were performed by non-physician practitioner and as supervising physician I was immediately available for consultation/collaboration.  Raeford Razor, MD 07/12/13 [phone]

## 2013-07-12 NOTE — ED Notes (Signed)
Pt desat to 85% on room air, placed on 2L O2 per Kirichenko PA, 99-100%.

## 2013-07-12 NOTE — ED Notes (Signed)
Cardiology at bedside.

## 2013-07-12 NOTE — Progress Notes (Signed)
ANTICOAGULATION CONSULT NOTE - Initial Consult  Pharmacy Consult for heparin Indication: chest pain/ACS  No Known Allergies  Patient Measurements: weight 117 kg, height 65 inches   Heparin Dosing Weight: 85 kg  Vital Signs: Temp: 97.9 F (36.6 C) (08/15 1747) Temp src: Oral (08/15 1747) BP: 128/56 mmHg (08/15 1747) Pulse Rate: 77 (08/15 1747)  Labs:  Recent Labs  07/12/13 1508  HGB 11.8*  HCT 34.5*  PLT 208  CREATININE 0.99    The CrCl is unknown because both a height and weight (above a minimum accepted value) are required for this calculation.   Medical History: Past Medical History  Diagnosis Date  . Back pain   . Diverticulosis April 2012  . Depression   . Uterine cancer 2011    endometrialca  . Hx of radiation therapy 03/08/11-04/18/11& 5/29,6/04/03/11/12    external beam and intracavity brachytherapy  . Anxiety   . Arthritis     degenerative  . Obesity   . Anemia     multifactorial,r/t chemo and radiation also  . History of mammogram 06/14/11    b/l mammogram  . Cataract   . Mild aortic stenosis     a. 08/2012 Echo: EF 65-70%, Gr 2 DD, mild biatrial enlargement, mild AS.  Marland Kitchen Abnormal ECG     a. 03/2013 cardiolite: EF 77%, normal perfusion.  . Syncope     a. cardionet showed sinus rhythm in setting of near syncope;  b. 03/2013: s/p ILR    Medications:  See med rec  Assessment: Patient is a 67 y.o F presented to the ED with CP.  Plan for possible cath on 8/18.  Goal of Therapy:  Heparin level 0.3-0.7 units/ml Monitor platelets by anticoagulation protocol: Yes   Plan:  1) heparin 4000 units IV x1 bolus, then heparin drip at 1000 units/hr 2) check 6 hour heparin level  Jadarius Commons P 07/12/2013,7:29 PM

## 2013-07-13 DIAGNOSIS — I200 Unstable angina: Secondary | ICD-10-CM | POA: Diagnosis present

## 2013-07-13 LAB — CBC
HCT: 32.9 % — ABNORMAL LOW (ref 36.0–46.0)
Hemoglobin: 10.9 g/dL — ABNORMAL LOW (ref 12.0–15.0)
MCV: 90.9 fL (ref 78.0–100.0)
RDW: 16.1 % — ABNORMAL HIGH (ref 11.5–15.5)
WBC: 4.7 10*3/uL (ref 4.0–10.5)

## 2013-07-13 LAB — HEPARIN LEVEL (UNFRACTIONATED)
Heparin Unfractionated: 0.26 IU/mL — ABNORMAL LOW (ref 0.30–0.70)
Heparin Unfractionated: 0.43 IU/mL (ref 0.30–0.70)

## 2013-07-13 LAB — LIPID PANEL
HDL: 42 mg/dL (ref >39)
LDL Cholesterol: 98 mg/dL (ref 0–99)
Total CHOL/HDL Ratio: 4.1 RATIO
VLDL: 33 mg/dL (ref 0–40)

## 2013-07-13 LAB — HEMOGLOBIN A1C
Hgb A1c MFr Bld: 5.7 % — ABNORMAL HIGH (ref <5.7)
Mean Plasma Glucose: 117 mg/dL — ABNORMAL HIGH (ref <117)

## 2013-07-13 LAB — TROPONIN I
Troponin I: <0.3 ng/mL (ref <0.30)
Troponin I: <0.3 ng/mL (ref <0.30)

## 2013-07-13 MED ORDER — BIOTENE DRY MOUTH MT LIQD
15.0000 mL | Freq: Two times a day (BID) | OROMUCOSAL | Status: DC
  Administered 2013-07-13 – 2013-07-14 (×4): 15 mL via OROMUCOSAL

## 2013-07-13 MED ORDER — FLUOXETINE HCL 20 MG PO TABS
20.0000 mg | ORAL_TABLET | Freq: Three times a day (TID) | ORAL | Status: DC
  Administered 2013-07-13 – 2013-07-14 (×6): 20 mg via ORAL
  Filled 2013-07-13 (×9): qty 1

## 2013-07-13 NOTE — Progress Notes (Signed)
   Primary cardiologist: Dr. Olga Millers  Subjective:   No active chest pain or breathlessness.   Objective:   Temp:  [97.5 F (36.4 C)-98.6 F (37 C)] 98.6 F (37 C) (08/16 0811) Pulse Rate:  [62-77] 63 (08/16 0812) Resp:  [13-26] 18 (08/16 0812) BP: (93-157)/(35-86) 106/48 mmHg (08/16 0812) SpO2:  [95 %-100 %] 98 % (08/16 0812) Weight:  [258 lb 13.1 oz (117.4 kg)-262 lb 5.6 oz (119 kg)] 262 lb 5.6 oz (119 kg) (08/16 0300) Last BM Date: 07/12/13  Filed Weights   07/12/13 2000 07/13/13 0300  Weight: 258 lb 13.1 oz (117.4 kg) 262 lb 5.6 oz (119 kg)    Intake/Output Summary (Last 24 hours) at 07/13/13 0932 Last data filed at 07/13/13 0800  Gross per 24 hour  Intake 230.17 ml  Output    775 ml  Net -544.83 ml   Telemetry: Sinus rhythm.  Exam:  General: Appears comfortable.  Lungs: Clear, nonlabored.  Cardiac: RRR, no gallop.  Extremities: No edema.  Lab Results:  Basic Metabolic Panel:  Recent Labs Lab 07/12/13 1508  NA 138  K 3.7  CL 100  CO2 22  GLUCOSE 95  BUN 20  CREATININE 0.99  CALCIUM 9.2    Liver Function Tests:  Recent Labs Lab 07/12/13 1508  AST 13  ALT 10  ALKPHOS 81  BILITOT 0.3  PROT 6.8  ALBUMIN 3.5    CBC:  Recent Labs Lab 07/12/13 1508 07/13/13 0500  WBC 7.1 4.7  HGB 11.8* 10.9*  HCT 34.5* 32.9*  MCV 88.9 90.9  PLT 208 165    Cardiac Enzymes:  Recent Labs Lab 07/12/13 2050 07/13/13 0235  TROPONINI <0.30 <0.30    BNP:  Recent Labs  07/12/13 1508  PROBNP 157.1*    Coagulation:  Recent Labs Lab 07/12/13 2049  INR 0.96    ECG: Sinus rhythm with nonspecific ST-T changes, consider anterior ischemia.   Medications:   Scheduled Medications: . antiseptic oral rinse  15 mL Mouth Rinse BID  . [START ON 07/15/2013] aspirin  324 mg Oral Pre-Cath  . aspirin EC  81 mg Oral Daily  . atorvastatin  80 mg Oral q1800  . cholecalciferol  2,000 Units Oral Daily  . ferrous sulfate  325 mg Oral Q breakfast   . FLUoxetine  60 mg Oral Daily  . multivitamin with minerals  1 tablet Oral Daily  . nitroGLYCERIN  0.5 inch Topical Q6H  . pantoprazole  40 mg Oral Daily  . sodium chloride  3 mL Intravenous Q12H  . vitamin B-12  1,000 mcg Oral Daily     Infusions: . [START ON 07/15/2013] sodium chloride    . heparin 1,250 Units/hr (07/13/13 0700)     PRN Medications:  sodium chloride, acetaminophen, ALPRAZolam, morphine injection, nitroGLYCERIN, ondansetron (ZOFRAN) IV, sodium chloride, traZODone   Assessment:   1. Unstable angina, cardiac markers normal. Plan is for medical therapy and diagnostic cardiac catheterization on Monday.  2. History of syncope with implantable loop recorder in place, no recent events.  3. LDL 98.  Plan/Discussion:    Patient is scheduled for a diagnostic cardiac catheterization on Monday to clearly define coronary anatomy and assess for potential revascularization options. Continue medical therapy.   Jonelle Sidle, M.D., F.A.C.C.

## 2013-07-13 NOTE — ED Provider Notes (Signed)
Medical screening examination/treatment/procedure(s) were performed by non-physician practitioner and as supervising physician I was immediately available for consultation/collaboration.  Shon Baton, MD 07/13/13 [phone]

## 2013-07-13 NOTE — Progress Notes (Signed)
ANTICOAGULATION CONSULT NOTE - Follow Up Consult  Pharmacy Consult for Heparin Indication: chest pain/ACS  No Known Allergies  Patient Measurements: Height: 5\' 5"  (165.1 cm) Weight: 262 lb 5.6 oz (119 kg) IBW/kg (Calculated) : 57 Heparin Dosing Weight: 85.6 kg  Vital Signs: Temp: 98.6 F (37 C) (08/16 0811) Temp src: Oral (08/16 0811) BP: 106/48 mmHg (08/16 0812) Pulse Rate: 63 (08/16 0812)  Labs:  Recent Labs  07/12/13 1508 07/12/13 2049 07/12/13 2050 07/13/13 0220 07/13/13 0235 07/13/13 0500 07/13/13 0919 07/13/13 0920  HGB 11.8*  --   --   --   --  10.9*  --   --   HCT 34.5*  --   --   --   --  32.9*  --   --   PLT 208  --   --   --   --  165  --   --   LABPROT  --  12.6  --   --   --   --   --   --   INR  --  0.96  --   --   --   --   --   --   HEPARINUNFRC  --   --   --  0.20*  --   --   --  0.26*  CREATININE 0.99  --   --   --   --   --   --   --   TROPONINI  --   --  <0.30  --  <0.30  --  <0.30  --     Estimated Creatinine Clearance: 70.2 ml/min (by C-G formula based on Cr of 0.99).   Medications:  Infusions:  . [START ON 07/15/2013] sodium chloride    . heparin 1,250 Units/hr (07/13/13 0700)    Assessment: 67 y/o female on a heparin drip for chest pain. Heparin level is subtherapeutic at 0.26 on 1250 units/hr. Troponins are negative. No bleeding noted, CBC is stable.  Goal of Therapy:  Heparin level 0.3-0.7 units/ml Monitor platelets by anticoagulation protocol: Yes   Plan:  -Increase heparin drip to 1450 units/hr -Heparin level 6 hours after rate change -Daily heparin level and CBC -Monitor for signs/symptoms of bleeding  [facility], [address].D., BCPS Clinical Pharmacist Pager: [phone] 07/13/2013 11:23 AM

## 2013-07-13 NOTE — Progress Notes (Signed)
ANTICOAGULATION CONSULT NOTE   Pharmacy Consult for Heparin Indication: chest pain/ACS  No Known Allergies  Patient Measurements: Height: 5\' 5"  (165.1 cm) Weight: 262 lb 5.6 oz (119 kg) IBW/kg (Calculated) : 57 Heparin Dosing Weight: 85.6 kg  Vital Signs: Temp: 98.1 F (36.7 C) (08/16 1617) Temp src: Oral (08/16 1617) BP: 110/60 mmHg (08/16 1618) Pulse Rate: 73 (08/16 1618)  Labs:  Recent Labs  07/12/13 1508 07/12/13 2049 07/12/13 2050 07/13/13 0220 07/13/13 0235 07/13/13 0500 07/13/13 0919 07/13/13 0920 07/13/13 1849  HGB 11.8*  --   --   --   --  10.9*  --   --   --   HCT 34.5*  --   --   --   --  32.9*  --   --   --   PLT 208  --   --   --   --  165  --   --   --   LABPROT  --  12.6  --   --   --   --   --   --   --   INR  --  0.96  --   --   --   --   --   --   --   HEPARINUNFRC  --   --   --  0.20*  --   --   --  0.26* 0.43  CREATININE 0.99  --   --   --   --   --   --   --   --   TROPONINI  --   --  <0.30  --  <0.30  --  <0.30  --   --     Estimated Creatinine Clearance: 70.2 ml/min (by C-G formula based on Cr of 0.99).   Medications:  Infusions:  . [START ON 07/15/2013] sodium chloride    . heparin 1,450 Units/hr (07/13/13 1159)    Assessment: 67 y/o female on a heparin drip for chest pain. Heparin level is now at goal at 0.4 on 1450 units/hr. Troponins are negative. No bleeding noted, CBC is stable.  Goal of Therapy:  Heparin level 0.3-0.7 units/ml Monitor platelets by anticoagulation protocol: Yes   Plan:  -Continue heparin drip at 1450 units/hr -Heparin level daily with CBC -Monitor for signs/symptoms of bleeding  Sheppard Coil PharmD., BCPS Clinical Pharmacist Pager [phone] 07/13/2013 7:27 PM

## 2013-07-13 NOTE — Progress Notes (Signed)
ANTICOAGULATION CONSULT NOTE - Initial Consult  Pharmacy Consult for heparin Indication: chest pain/ACS  No Known Allergies  Patient Measurements: weight 117 kg, height 65 inches Height: 5\' 5"  (165.1 cm) Weight: 258 lb 13.1 oz (117.4 kg) IBW/kg (Calculated) : 57 Heparin Dosing Weight: 85 kg  Vital Signs: Temp: 98.3 F (36.8 C) (08/16 0026) Temp src: Oral (08/16 0026) BP: 111/65 mmHg (08/16 0000) Pulse Rate: 69 (08/15 2100)  Labs:  Recent Labs  07/12/13 1508 07/12/13 2049 07/12/13 2050 07/13/13 0220 07/13/13 0235  HGB 11.8*  --   --   --   --   HCT 34.5*  --   --   --   --   PLT 208  --   --   --   --   LABPROT  --  12.6  --   --   --   INR  --  0.96  --   --   --   HEPARINUNFRC  --   --   --  0.20*  --   CREATININE 0.99  --   --   --   --   TROPONINI  --   --  <0.30  --  <0.30    Estimated Creatinine Clearance: 69.7 ml/min (by C-G formula based on Cr of 0.99).   Medical History: Past Medical History  Diagnosis Date  . Back pain   . Diverticulosis April 2012  . Depression   . Hx of radiation therapy 03/08/11-04/18/11& 5/29,6/04/03/11/12    external beam and intracavity brachytherapy  . Anxiety   . Obesity   . Anemia     multifactorial,r/t chemo and radiation also  . History of mammogram 06/14/11    b/l mammogram  . Cataract     "right one is still there" (07/12/2013)  . Mild aortic stenosis     a. 08/2012 Echo: EF 65-70%, Gr 2 DD, mild biatrial enlargement, mild AS.  Marland Kitchen Abnormal ECG     a. 03/2013 cardiolite: EF 77%, normal perfusion.  . Syncope     a. cardionet showed sinus rhythm in setting of near syncope;  b. 03/2013: s/p ILR  . Uterine cancer 2011    "advanced; Dr. Duard Brady" (07/12/2013)  . Heart murmur   . Chest pain at rest     "just today" (07/12/2013)  . History of blood transfusion ?2012    "when I was in treatment for cancer" (07/12/2013  . GERD (gastroesophageal reflux disease)   . Arthritis     degenerative; "fingers; lower back" (07/12/2013)     Medications:  See med rec  Assessment: Heparin level 0.20 no bleeding noted   Goal of Therapy:  Heparin level 0.3-0.7 units/ml Monitor platelets by anticoagulation protocol: Yes   Plan:  Increase heparin to 1250 units/hr and recheck in 6 hours. Janice Coffin 07/13/2013,3:45 AM

## 2013-07-14 DIAGNOSIS — F411 Generalized anxiety disorder: Secondary | ICD-10-CM

## 2013-07-14 LAB — CBC
MCH: 29.7 pg (ref 26.0–34.0)
MCHC: 32.2 g/dL (ref 30.0–36.0)
MCV: 92.3 fL (ref 78.0–100.0)
Platelets: 167 10*3/uL (ref 150–400)

## 2013-07-14 LAB — BASIC METABOLIC PANEL
CO2: 24 mEq/L (ref 19–32)
Calcium: 9 mg/dL (ref 8.4–10.5)
Creatinine, Ser: 0.99 mg/dL (ref 0.50–1.10)
GFR calc non Af Amer: 57 mL/min — ABNORMAL LOW (ref >90)
Sodium: 139 mEq/L (ref 135–145)

## 2013-07-14 NOTE — Progress Notes (Signed)
ANTICOAGULATION CONSULT NOTE - Follow Up Consult  Pharmacy Consult for Heparin Indication: chest pain/ACS  No Known Allergies  Patient Measurements: Height: 5\' 5"  (165.1 cm) Weight: 261 lb 11 oz (118.7 kg) IBW/kg (Calculated) : 57 Heparin Dosing Weight: 85.6 kg  Vital Signs: Temp: 97.6 F (36.4 C) (08/17 0824) Temp src: Oral (08/17 0824) BP: 125/51 mmHg (08/17 0824) Pulse Rate: 78 (08/17 0824)  Labs:  Recent Labs  07/12/13 1508 07/12/13 2049 07/12/13 2050  07/13/13 0235 07/13/13 0500 07/13/13 0919 07/13/13 0920 07/13/13 1849 07/14/13 0502  HGB 11.8*  --   --   --   --  10.9*  --   --   --  10.4*  HCT 34.5*  --   --   --   --  32.9*  --   --   --  32.3*  PLT 208  --   --   --   --  165  --   --   --  167  LABPROT  --  12.6  --   --   --   --   --   --   --   --   INR  --  0.96  --   --   --   --   --   --   --   --   HEPARINUNFRC  --   --   --   < >  --   --   --  0.26* 0.43 0.51  CREATININE 0.99  --   --   --   --   --   --   --   --  0.99  TROPONINI  --   --  <0.30  --  <0.30  --  <0.30  --   --   --   < > = values in this interval not displayed.  Estimated Creatinine Clearance: 70.1 ml/min (by C-G formula based on Cr of 0.99).   Medications:  Infusions:  . [START ON 07/15/2013] sodium chloride    . heparin 1,450 Units/hr (07/14/13 0700)    Assessment: 68 y/o female on a heparin drip for chest pain. Heparin level is therapeutic at 0.51 on 1450 units/hr. Troponins are negative. No bleeding noted, CBC is stable.  Goal of Therapy:  Heparin level 0.3-0.7 units/ml Monitor platelets by anticoagulation protocol: Yes   Plan:  -Continue heparin drip at 1450 units/hr -Daily heparin level and CBC -Monitor for signs/symptoms of bleeding -F/U after cath tomorrow  [facility], Pharm.D., BCPS Clinical Pharmacist Pager: [phone] 07/14/2013 10:08 AM

## 2013-07-14 NOTE — Progress Notes (Signed)
   Primary cardiologist: Dr. Olga Millers  Subjective:   No chest pain overnight. Tearful and anxious this morning thinking about her psychosocial stressors in life.   Objective:   Temp:  [97.5 F (36.4 C)-98.1 F (36.7 C)] 97.6 F (36.4 C) (08/17 0824) Pulse Rate:  [63-78] 78 (08/17 0824) Resp:  [13-19] 17 (08/17 0824) BP: (104-125)/(47-60) 125/51 mmHg (08/17 0824) SpO2:  [96 %-98 %] 97 % (08/17 0824) Weight:  [261 lb 11 oz (118.7 kg)] 261 lb 11 oz (118.7 kg) (08/17 0500) Last BM Date: 07/14/13  Filed Weights   07/12/13 2000 07/13/13 0300 07/14/13 0500  Weight: 258 lb 13.1 oz (117.4 kg) 262 lb 5.6 oz (119 kg) 261 lb 11 oz (118.7 kg)    Intake/Output Summary (Last 24 hours) at 07/14/13 0909 Last data filed at 07/14/13 0800  Gross per 24 hour  Intake 1813.53 ml  Output   2250 ml  Net -436.47 ml   Telemetry: Sinus rhythm.  Exam:  General: No acute distress.  Lungs: Clear, nonlabored.  Cardiac: RRR, no gallop.  Extremities: No edema.  Lab Results:  Basic Metabolic Panel:  Recent Labs Lab 07/12/13 1508 07/14/13 0502  NA 138 139  K 3.7 4.1  CL 100 104  CO2 22 24  GLUCOSE 95 100*  BUN 20 20  CREATININE 0.99 0.99  CALCIUM 9.2 9.0    CBC:  Recent Labs Lab 07/12/13 1508 07/13/13 0500 07/14/13 0502  WBC 7.1 4.7 5.4  HGB 11.8* 10.9* 10.4*  HCT 34.5* 32.9* 32.3*  MCV 88.9 90.9 92.3  PLT 208 165 167    Cardiac Enzymes:  Recent Labs Lab 07/12/13 2050 07/13/13 0235 07/13/13 0919  TROPONINI <0.30 <0.30 <0.30    Coagulation:  Recent Labs Lab 07/12/13 2049  INR 0.96    ECG: Sinus rhythm with nonspecific ST-T changes, consider anterior ischemia.   Medications:   Scheduled Medications: . antiseptic oral rinse  15 mL Mouth Rinse BID  . [START ON 07/15/2013] aspirin  324 mg Oral Pre-Cath  . aspirin EC  81 mg Oral Daily  . atorvastatin  80 mg Oral q1800  . cholecalciferol  2,000 Units Oral Daily  . ferrous sulfate  325 mg Oral Q  breakfast  . FLUoxetine  20 mg Oral TID  . multivitamin with minerals  1 tablet Oral Daily  . nitroGLYCERIN  0.5 inch Topical Q6H  . pantoprazole  40 mg Oral Daily  . sodium chloride  3 mL Intravenous Q12H  . vitamin B-12  1,000 mcg Oral Daily    Infusions: . [START ON 07/15/2013] sodium chloride    . heparin 1,450 Units/hr (07/14/13 0700)    PRN Medications: sodium chloride, acetaminophen, ALPRAZolam, morphine injection, nitroGLYCERIN, ondansetron (ZOFRAN) IV, sodium chloride, traZODone   Assessment:   1. Unstable angina, cardiac markers normal. Plan is for medical therapy and diagnostic cardiac catheterization on Monday.  2. History of syncope with implantable loop recorder in place, no recent events.  3. LDL 98.  4. Significant psychosocial stressors leading to anxiety. Could even be contributing to problem number 1, potential for stress-induced cardiomyopathy.  Plan/Discussion:    Patient is scheduled for a diagnostic cardiac catheterization tomorrow. Continue medical therapy. Also request case manager consultation.   Jonelle Sidle, M.D., F.A.C.C.

## 2013-07-15 ENCOUNTER — Encounter (HOSPITAL_COMMUNITY)
Admission: EM | Disposition: A | Discharge status: Home / Self Care | Payer: Self-pay | Source: Home / Self Care | Attending: Internal Medicine

## 2013-07-15 DIAGNOSIS — R079 Chest pain, unspecified: Secondary | ICD-10-CM

## 2013-07-15 DIAGNOSIS — I359 Nonrheumatic aortic valve disorder, unspecified: Secondary | ICD-10-CM

## 2013-07-15 HISTORY — PX: LEFT HEART CATHETERIZATION WITH CORONARY ANGIOGRAM: SHX5451

## 2013-07-15 LAB — CBC
MCH: 29.7 pg (ref 26.0–34.0)
MCHC: 32.1 g/dL (ref 30.0–36.0)
Platelets: 150 10*3/uL (ref 150–400)

## 2013-07-15 LAB — BASIC METABOLIC PANEL
Calcium: 9.1 mg/dL (ref 8.4–10.5)
GFR calc non Af Amer: 50 mL/min — ABNORMAL LOW (ref >90)
Glucose, Bld: 99 mg/dL (ref 70–99)
Sodium: 139 mEq/L (ref 135–145)

## 2013-07-15 SURGERY — LEFT HEART CATHETERIZATION WITH CORONARY ANGIOGRAM
Anesthesia: LOCAL

## 2013-07-15 MED ORDER — VERAPAMIL HCL 2.5 MG/ML IV SOLN
INTRAVENOUS | Status: AC
  Filled 2013-07-15: qty 2

## 2013-07-15 MED ORDER — MIDAZOLAM HCL 2 MG/2ML IJ SOLN
INTRAMUSCULAR | Status: AC
  Filled 2013-07-15: qty 2

## 2013-07-15 MED ORDER — HEPARIN (PORCINE) IN NACL 2-0.9 UNIT/ML-% IJ SOLN
INTRAMUSCULAR | Status: AC
  Filled 2013-07-15: qty 1000

## 2013-07-15 MED ORDER — LIDOCAINE HCL (PF) 1 % IJ SOLN
INTRAMUSCULAR | Status: AC
  Filled 2013-07-15: qty 30

## 2013-07-15 MED ORDER — HEPARIN SODIUM (PORCINE) 1000 UNIT/ML IJ SOLN
INTRAMUSCULAR | Status: AC
  Filled 2013-07-15: qty 1

## 2013-07-15 MED ORDER — FENTANYL CITRATE 0.05 MG/ML IJ SOLN
INTRAMUSCULAR | Status: AC
Start: 2013-07-15 — End: 2013-07-15
  Filled 2013-07-15: qty 2

## 2013-07-15 MED ORDER — NITROGLYCERIN 0.2 MG/ML ON CALL CATH LAB
INTRAVENOUS | Status: AC
  Filled 2013-07-15: qty 1

## 2013-07-15 MED ORDER — SODIUM CHLORIDE 0.9 % IV SOLN: 1.0000 mL/kg/h | INTRAVENOUS | Status: DC

## 2013-07-15 NOTE — Progress Notes (Signed)
   Primary cardiologist: Dr. Brian Crenshaw  Subjective:   No chest pain or dyspnea.   Objective:   Temp:  [97.4 F (36.3 C)-98 F (36.7 C)] 97.4 F (36.3 C) (08/18 0400) Pulse Rate:  [63-78] 66 (08/18 0000) Resp:  [14-19] 15 (08/18 0300) BP: (106-132)/(48-75) 110/50 mmHg (08/18 0300) SpO2:  [97 %-98 %] 98 % (08/17 2300) Weight:  [258 lb 13.1 oz (117.4 kg)] 258 lb 13.1 oz (117.4 kg) (08/17 2200) Last BM Date: 07/14/13  Filed Weights   07/13/13 0300 07/14/13 0500 07/14/13 2200  Weight: 262 lb 5.6 oz (119 kg) 261 lb 11 oz (118.7 kg) 258 lb 13.1 oz (117.4 kg)    Intake/Output Summary (Last 24 hours) at 07/15/13 0656 Last data filed at 07/15/13 0600  Gross per 24 hour  Intake 1882.5 ml  Output    650 ml  Net 1232.5 ml   Telemetry: Sinus rhythm.  Exam:  General: No acute distress.  Neck: supple  Lungs: Clear, nonlabored.  Cardiac: RRR, 2/6 systolic murmur LSB  Abd: soft  Extremities: No edema.  Lab Results:  Basic Metabolic Panel:  Recent Labs Lab 07/12/13 1508 07/14/13 0502 07/15/13 0440  NA 138 139 139  K 3.7 4.1 4.0  CL 100 104 105  CO2 22 24 25  GLUCOSE 95 100* 99  BUN 20 20 18  CREATININE 0.99 0.99 1.10  CALCIUM 9.2 9.0 9.1    CBC:  Recent Labs Lab 07/13/13 0500 07/14/13 0502 07/15/13 0440  WBC 4.7 5.4 5.0  HGB 10.9* 10.4* 10.5*  HCT 32.9* 32.3* 32.7*  MCV 90.9 92.3 92.4  PLT 165 167 150    Cardiac Enzymes:  Recent Labs Lab 07/12/13 2050 07/13/13 0235 07/13/13 0919  TROPONINI <0.30 <0.30 <0.30    Coagulation:  Recent Labs Lab 07/12/13 2049  INR 0.96      Medications:   Scheduled Medications: . antiseptic oral rinse  15 mL Mouth Rinse BID  . aspirin EC  81 mg Oral Daily  . atorvastatin  80 mg Oral q1800  . cholecalciferol  2,000 Units Oral Daily  . ferrous sulfate  325 mg Oral Q breakfast  . FLUoxetine  20 mg Oral TID  . multivitamin with minerals  1 tablet Oral Daily  . pantoprazole  40 mg Oral Daily  .  sodium chloride  3 mL Intravenous Q12H  . vitamin B-12  1,000 mcg Oral Daily    Infusions: . sodium chloride 100 mL/hr at 07/15/13 0407  . heparin 1,450 Units/hr (07/15/13 0600)    PRN Medications: sodium chloride, acetaminophen, ALPRAZolam, morphine injection, nitroGLYCERIN, ondansetron (ZOFRAN) IV, sodium chloride, traZODone   Assessment:   1. Unstable angina, cardiac markers normal. Plan is for medical therapy and diagnostic cardiac catheterization today.  2. History of syncope with implantable loop recorder in place, no recent events.  3. H/O mild AS - mild on exam    Plan/Discussion:    Patient is scheduled for a diagnostic cardiac catheterization today. Continue medical therapy. If negative, she can be DCed and fu with me. ILR in place; no recent syncopal episodes.   Brian Crenshaw, M.D., F.A.C.C.  

## 2013-07-15 NOTE — Clinical Social Work Note (Signed)
Clinical Social Work Department BRIEF PSYCHOSOCIAL ASSESSMENT 07/15/2013  Patient:  [name], [name]     Account Number:  [id number]     Admit date:  07/12/2013  Clinical Social Worker:  Hulan Fray  Date/Time:  07/15/2013 11:02 AM  Referred by:  Physician  Date Referred:  07/15/2013 Referred for  Other - See comment   Other Referral:   "Stressful work situation. Pt for d/c, requests to be seen"   Interview type:  Patient Other interview type:   Education officer, environmental and church member    PSYCHOSOCIAL DATA Living Status:  HUSBAND Admitted from facility:   Level of care:   Primary support name:  Clide Cliff Primary support relationship to patient:  CHILD, ADULT Degree of support available:   supportive    CURRENT CONCERNS Current Concerns  Other - See comment   Other Concerns:    SOCIAL WORK ASSESSMENT / PLAN (Late Entry) Clinical Social Worker received referral to discuss with patient about her stressful situation. CSW met with patient and her pastor and church member at bedside. Patient reported that her husband is disabled and has financial concerns. Patient reported that she is under a lot of stress. CSW attempted to problem solve with patient.CSW inquired if patient attemped to apply for Medicaid and patient reported that she makes $5 over the limit.  CSW inquired about the contacts listed in her chart regarding possible support, and she said that her son and daughter-in-law live in [place] and her son is in the [employer]. Patient reported that her son does send her money at times, when he is available, but her daughter in-law calls her about ever day. Patient reported that she does have a supportive sister, but her sister and her husband adopted a handicapped child and they both work in a hospital. Patient reported that her sister calls her to check on her as well. 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 CSW offered  resources for mental health and psychiatry outpatient resources and patient was agreeable. CSW provided list and patient reported that she would [facility] as a first step to talk through her stressors.   Assessment/plan status:  Information/Referral to [facility] Other assessment/ plan:   Information/referral to community resources:   Mental Health and Psychiatry outpatient resources    PATIENT'S/FAMILY'S RESPONSE TO PLAN OF CARE: Patient was agreeable to contactint a counseling center to begin working on stressors. Patient was appreciative of CSW"s visit and resources provided.

## 2013-07-15 NOTE — CV Procedure (Signed)
   Cardiac Catheterization Procedure Note  Name: [name] MRN: [id number] DOB: March 05, 1946  Procedure: Left Heart Cath, Selective Coronary Angiography, LV angiography  Indication: 67 yo WF with history of HTN and hyperlipidemia presents with symptoms of chest pain at rest.   Procedural Details: The right wrist was prepped, draped, and anesthetized with 1% lidocaine. Using the modified Seldinger technique, a 5 French sheath was introduced into the right radial artery. 3 mg of verapamil was administered through the sheath, weight-based unfractionated heparin was administered intravenously. Standard Judkins catheters were used for selective coronary angiography and left ventriculography. Catheter exchanges were performed over an exchange length guidewire. There were no immediate procedural complications. A TR band was used for radial hemostasis at the completion of the procedure.  The patient was transferred to the post catheterization recovery area for further monitoring.  Procedural Findings: Hemodynamics: AO 112/60 meaqn 82 mm Hg LV 115/24  Coronary angiography: Coronary dominance: right  Left mainstem: Normal  Left anterior descending (LAD): Normal  Left circumflex (LCx): Normal  Right coronary artery (RCA): Arises low in the aortic root and anteriorly. Normal  Left ventriculography: Left ventricular systolic function is normal, LVEF is estimated at 55-65%, there is no significant mitral regurgitation   Final Conclusions:   1. Normal coronary anatomy 2. Normal LV function.  Recommendations: Evaluate for noncardiac causes of chest pain as an outpatient.  Theron Arista [facility] 07/15/2013, 8:24 AM

## 2013-07-15 NOTE — Interval H&P Note (Signed)
History and Physical Interval Note:  07/15/2013 7:52 AM  [name]  has presented today for surgery, with the diagnosis of cp  The various methods of treatment have been discussed with the patient and family. After consideration of risks, benefits and other options for treatment, the patient has consented to  Procedure(s): LEFT HEART CATHETERIZATION WITH CORONARY ANGIOGRAM (N/A) as a surgical intervention .  The patient's history has been reviewed, patient examined, no change in status, stable for surgery.  I have reviewed the patient's chart and labs.  Questions were answered to the patient's satisfaction.    Cath Lab Visit (complete for each Cath Lab visit)  Clinical Evaluation Leading to the Procedure:   ACS: yes  Non-ACS:    Anginal Classification: CCS III  Anti-ischemic medical therapy: No Therapy  Non-Invasive Test Results: Low-risk stress test findings: cardiac mortality <1%/year  Prior CABG: No previous CABG       Theron Arista [facility] 07/15/2013 7:52 AM

## 2013-07-15 NOTE — H&P (View-Only) (Signed)
   Primary cardiologist: Dr. Olga Millers  Subjective:   No chest pain or dyspnea.   Objective:   Temp:  [97.4 F (36.3 C)-98 F (36.7 C)] 97.4 F (36.3 C) (08/18 0400) Pulse Rate:  [63-78] 66 (08/18 0000) Resp:  [14-19] 15 (08/18 0300) BP: (106-132)/(48-75) 110/50 mmHg (08/18 0300) SpO2:  [97 %-98 %] 98 % (08/17 2300) Weight:  [258 lb 13.1 oz (117.4 kg)] 258 lb 13.1 oz (117.4 kg) (08/17 2200) Last BM Date: 07/14/13  Filed Weights   07/13/13 0300 07/14/13 0500 07/14/13 2200  Weight: 262 lb 5.6 oz (119 kg) 261 lb 11 oz (118.7 kg) 258 lb 13.1 oz (117.4 kg)    Intake/Output Summary (Last 24 hours) at 07/15/13 0656 Last data filed at 07/15/13 0600  Gross per 24 hour  Intake 1882.5 ml  Output    650 ml  Net 1232.5 ml   Telemetry: Sinus rhythm.  Exam:  General: No acute distress.  Neck: supple  Lungs: Clear, nonlabored.  Cardiac: RRR, 2/6 systolic murmur LSB  Abd: soft  Extremities: No edema.  Lab Results:  Basic Metabolic Panel:  Recent Labs Lab 07/12/13 1508 07/14/13 0502 07/15/13 0440  NA 138 139 139  K 3.7 4.1 4.0  CL 100 104 105  CO2 22 24 25   GLUCOSE 95 100* 99  BUN 20 20 18   CREATININE 0.99 0.99 1.10  CALCIUM 9.2 9.0 9.1    CBC:  Recent Labs Lab 07/13/13 0500 07/14/13 0502 07/15/13 0440  WBC 4.7 5.4 5.0  HGB 10.9* 10.4* 10.5*  HCT 32.9* 32.3* 32.7*  MCV 90.9 92.3 92.4  PLT 165 167 150    Cardiac Enzymes:  Recent Labs Lab 07/12/13 2050 07/13/13 0235 07/13/13 0919  TROPONINI <0.30 <0.30 <0.30    Coagulation:  Recent Labs Lab 07/12/13 2049  INR 0.96      Medications:   Scheduled Medications: . antiseptic oral rinse  15 mL Mouth Rinse BID  . aspirin EC  81 mg Oral Daily  . atorvastatin  80 mg Oral q1800  . cholecalciferol  2,000 Units Oral Daily  . ferrous sulfate  325 mg Oral Q breakfast  . FLUoxetine  20 mg Oral TID  . multivitamin with minerals  1 tablet Oral Daily  . pantoprazole  40 mg Oral Daily  .  sodium chloride  3 mL Intravenous Q12H  . vitamin B-12  1,000 mcg Oral Daily    Infusions: . sodium chloride 100 mL/hr at 07/15/13 0407  . heparin 1,450 Units/hr (07/15/13 0600)    PRN Medications: sodium chloride, acetaminophen, ALPRAZolam, morphine injection, nitroGLYCERIN, ondansetron (ZOFRAN) IV, sodium chloride, traZODone   Assessment:   1. Unstable angina, cardiac markers normal. Plan is for medical therapy and diagnostic cardiac catheterization today.  2. History of syncope with implantable loop recorder in place, no recent events.  3. H/O mild AS - mild on exam    Plan/Discussion:    Patient is scheduled for a diagnostic cardiac catheterization today. Continue medical therapy. If negative, she can be DCed and fu with me. ILR in place; no recent syncopal episodes.   Olga Millers, M.D., F.A.C.C.

## 2013-07-15 NOTE — Discharge Summary (Signed)
Patient seen and examined and history reviewed. Agree with above findings and plan. See cardiac cath note.  Theron [facility] 07/15/2013 3:11 PM

## 2013-07-15 NOTE — Care Management Note (Signed)
    Page 1 of 1   07/15/2013     11:05:20 AM   CARE MANAGEMENT NOTE 07/15/2013  Patient:  Pamela, Carlson   Account Number:  [id number]  Date Initiated:  07/15/2013  Documentation initiated by:  Junius Creamer  Subjective/Objective Assessment:   adm w angina     Action/Plan:   lives w husband, pcp dr Erenest Blank   Anticipated DC Date:  07/15/2013   Anticipated DC Plan:  HOME/SELF CARE      DC Planning Services  CM consult      Choice offered to / List presented to:             Status of service:   Medicare Important Message given?   (If response is "NO", the following Medicare IM given date fields will be blank) Date Medicare IM given:   Date Additional Medicare IM given:    Discharge Disposition:  HOME/SELF CARE  Per UR Regulation:  Reviewed for med. necessity/level of care/duration of stay  If discussed at Long Length of Stay Meetings, dates discussed:    Comments:  8/18 1103a debbie Annaleigha Woo rn,bsn called by short stay unit. pt wanted help w meds. med she gets are generic and pt is on [employer] and has medication coverage under her ins.

## 2013-07-15 NOTE — Discharge Summary (Signed)
CARDIOLOGY DISCHARGE SUMMARY   Patient ID: [name] MRN: [id number] DOB/AGE: 1946/02/04 67 y.o.  Admit date: 07/12/2013 Discharge date: 07/15/2013  Primary Discharge Diagnosis:   Intermediate coronary syndrome   Secondary Discharge Diagnosis:    Unstable angina  Procedures:  Left Heart Cath, Selective Coronary Angiography, LV angiography  Hospital Course: [name] is a 67 y.o. female with no history of CAD. She had chest pain concerning for angina that started at rest. She came to the ER, her ECG was abnormal and her pain was not completely controlled by nitroglycerin. She was admitted for further evaluation and treatment.   Her cardiac enzymes were negative for MI. She had a cardiac catheterization on 8/18, results below. No obstructive disease was found and she is to follow up with her primary MD for non-cardiac causes of chest pain.  A case management consult was called to see if any help was available for her medications. She has insurance and no further assistance is available.   [name] did well after her catheterization and is considered stable for discharge, to follow up as an outpatient.   Labs:  Lab Results  Component Value Date   WBC 5.0 07/15/2013   HGB 10.5* 07/15/2013   HCT 32.7* 07/15/2013   MCV 92.4 07/15/2013   PLT 150 07/15/2013    Recent Labs Lab 07/12/13 1508  07/15/13 0440  NA 138  < > 139  K 3.7  < > 4.0  CL 100  < > 105  CO2 22  < > 25  BUN 20  < > 18  CREATININE 0.99  < > 1.10  CALCIUM 9.2  < > 9.1  PROT 6.8  --   --   BILITOT 0.3  --   --   ALKPHOS 81  --   --   ALT 10  --   --   AST 13  --   --   GLUCOSE 95  < > 99  < > = values in this interval not displayed.  Recent Labs  07/12/13 2050 07/13/13 0235 07/13/13 0919  TROPONINI <0.30 <0.30 <0.30   Lipid Panel     Component Value Date/Time   CHOL 173 07/13/2013 0500   TRIG 165* 07/13/2013 0500   HDL 42 07/13/2013 0500   CHOLHDL 4.1 07/13/2013 0500   VLDL 33 07/13/2013 0500   LDLCALC 98 07/13/2013 0500    Pro B Natriuretic peptide (BNP)  Date/Time Value Range Status  07/12/2013  3:08 PM 157.1* 0 - 125 pg/mL Final    Recent Labs  07/12/13 2049  INR 0.96      Radiology: Dg Chest 2 View 07/12/2013   CLINICAL DATA:  Chest pain. Shortness of breath.  EXAM: CHEST  2 VIEW  COMPARISON:  05/08/2013.  FINDINGS: Low lung volumes are present, causing crowding of the pulmonary vasculature. Loop recorder noted. Mild cardiomegaly.  Airway thickening is present, suggesting bronchitis or reactive airways disease. Thoracic spondylosis noted.  IMPRESSION: 1. Airway thickening is present, suggesting bronchitis or reactive airways disease. 2. Mild cardiomegaly, without edema. 3. Low lung volumes.   Electronically Signed   By: Herbie Baltimore   On: 07/12/2013 14:34    Cardiac Cath: 07/15/2013 Left mainstem: Normal  Left anterior descending (LAD): Normal  Left circumflex (LCx): Normal  Right coronary artery (RCA): Arises low in the aortic root and anteriorly. Normal  Left ventriculography: Left ventricular systolic function is normal, LVEF is estimated at 55-65%, there is no significant mitral  regurgitation  Final Conclusions:  1. Normal coronary anatomy  2. Normal LV function.  EKG: SR, non-specific ST changes  FOLLOW UP PLANS AND APPOINTMENTS No Known Allergies   Medication List         FLUoxetine 20 MG capsule  Commonly known as:  PROZAC  Take 60 mg by mouth daily.     furosemide 20 MG tablet  Commonly known as:  LASIX  Take 1 tablet (20 mg total) by mouth daily.     ibuprofen 600 MG tablet  Commonly known as:  ADVIL,MOTRIN  Take 1 tablet (600 mg total) by mouth every 8 (eight) hours as needed for pain.     Iron 240 (27 FE) MG Tabs  Take 1 tablet by mouth daily.     multivitamin capsule  Take 1 capsule by mouth daily.     pantoprazole 40 MG tablet  Commonly known as:  PROTONIX  Take 1 tablet (40 mg total) by mouth daily.     potassium chloride 10 MEQ  tablet  Commonly known as:  K-DUR  Take 1 tablet (10 mEq total) by mouth daily as needed. Take when you take the lasix     senna-docusate 8.6-50 MG per tablet  Commonly known as:  Senokot-S  Take 1 tablet by mouth daily as needed. For constipation     traZODone 50 MG tablet  Commonly known as:  DESYREL  Take 1-3 tablets (50-150 mg total) by mouth at bedtime as needed for sleep.     vitamin B-12 1000 MCG tablet  Commonly known as:  CYANOCOBALAMIN  Take 1,000 mcg by mouth daily.     Vitamin D3 1000 UNITS Caps  Take 2,000 Units by mouth daily.         Future Appointments Provider Department Dept Phone   07/16/2013 2:00 PM Agapito Games, MD [facility] [phone]   07/25/2013 9:00 AM Billie Lade, MD [facility] RADIATION ONCOLOGY [phone]   08/06/2013 10:00 AM Marinus Maw, MD [facility] Main Office [facility]) [phone]   10/31/2013 9:00 AM Rejeana Brock A. Duard Brady, MD [facility] GYNECOLOGICAL ONCOLOGY [phone]     Follow-up Information   Follow up with [facility] (As needed)    Contact information:   [address] [place] [address]       Schedule an appointment as soon as possible for a visit with METHENEY,CATHERINE, MD.   Specialty:  Family Medicine   Contact information:   [phone] [address] [place] [address] [phone]       BRING ALL MEDICATIONS WITH YOU TO FOLLOW UP APPOINTMENTS  Time spent with patient to include physician time: 35 min Signed: Theodore Demark, PA-C 07/15/2013, 2:54 PM Co-Sign MD

## 2013-07-15 NOTE — Progress Notes (Signed)
Chaplain was referred to pt by chaplain on call last night. Pt concerned about her husband who has a brain tumor and sometimes wanders. Pt expressed concern that her husband's son isn't caring for him adequately while pt is in hospital and that son has been largely estranged from his father for years. She faults the son for this. We had prayer about this and also prayer for pt to do well in her procedure tomorrow.

## 2013-07-16 ENCOUNTER — Ambulatory Visit (INDEPENDENT_AMBULATORY_CARE_PROVIDER_SITE_OTHER): Payer: Medicare PPO | Admitting: Family Medicine

## 2013-07-16 ENCOUNTER — Encounter: Payer: Self-pay | Admitting: Family Medicine

## 2013-07-16 VITALS — BP 129/69 | HR 76 | Wt 255.0 lb

## 2013-07-16 DIAGNOSIS — F430 Acute stress reaction: Secondary | ICD-10-CM

## 2013-07-16 DIAGNOSIS — R0789 Other chest pain: Secondary | ICD-10-CM

## 2013-07-16 DIAGNOSIS — F438 Other reactions to severe stress: Secondary | ICD-10-CM

## 2013-07-16 MED ORDER — BUPROPION HCL ER (XL) 150 MG PO TB24: 150.0000 mg | ORAL_TABLET | ORAL | Status: DC

## 2013-07-16 MED ORDER — ALPRAZOLAM 0.25 MG PO TABS: 0.2500 mg | ORAL_TABLET | Freq: Every day | ORAL | Status: AC | PRN

## 2013-07-16 NOTE — Progress Notes (Signed)
  Subjective:    Patient ID: [name], female    DOB: 1946/05/28, 67 y.o.   MRN: [id number]  HPI [name] to the hospital for CP on Froday, and had a neg cath yesterday. They felt it was stress related. They assigned her a Child psychotherapist and they are going to line her up with a counselor.  Says she doesn't want to go Mental health. They gave her xanax prn in the hospital.  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  Her husband has been chronically ill and unfortunately his family has not been very supportive. She is his primary caretaker.   Review of Systems     Objective:   Physical Exam  Constitutional: She is oriented to person, place, and time. She appears well-developed and well-nourished.  HENT:  Head: Normocephalic and atraumatic.  Cardiovascular: Normal rate, regular rhythm and normal heart sounds.   Pulmonary/Chest: Effort normal and breath sounds normal.  Neurological: She is alert and oriented to person, place, and time.  Skin: Skin is warm and dry.  Psychiatric: She has a normal mood and affect. Her behavior is normal.          Assessment & Plan:  Anxiety - Discussed options.  Will add wellbutrin to her fluoxetine. Will give small quantity of xanax to use prn. Warned about addictive potential. Using sparingly.  She is getting a Veterinary surgeon through social work. I'm hoping that this will be successful for her and that she can find someone that she can relate to and talk to. I think this would be a good release for her and help her be a little bit more constructive with how she is going to deal with her husband who is chronically ill and his family which has not been very helpful for her at all. 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 Right now it is a bit of a stressful and [place] because of a Therapist, music. I  think it will be good to just remove that from her routine for at least the next couple weeks and give her a chance to focus on herself, start a new medication and to establish with a therapist.  Time spent 25 minutes, greater than 50% of time spent counseling about her anxiety and current social stressors.

## 2013-07-16 NOTE — Patient Instructions (Signed)
Alprazolam tablets What is this medicine? ALPRAZOLAM (al PRAY zoe lam) is a benzodiazepine. It is used to treat anxiety and panic attacks. This medicine may be used for other purposes; ask your health care provider or pharmacist if you have questions. What should I tell my health care provider before I take this medicine? They need to know if you have any of these conditions: -an alcohol or drug abuse problem -bipolar disorder, depression, psychosis or other mental health conditions -glaucoma -kidney or liver disease -lung or breathing disease -myasthenia gravis -Parkinson's disease -porphyria -seizures or a history of seizures -suicidal thoughts -an unusual or allergic reaction to alprazolam, other benzodiazepines, foods, dyes, or preservatives -pregnant or trying to get pregnant -breast-feeding How should I use this medicine? Take this medicine by mouth with a glass of water. Follow the directions on the prescription label. Take your medicine at regular intervals. Do not take it more often than directed. If you have been taking this medicine regularly for some time, do not suddenly stop taking it. You must gradually reduce the dose or you may get severe side effects. Ask your doctor or health care professional for advice. Even after you stop taking this medicine it can still affect your body for several days. Talk to your pediatrician regarding the use of this medicine in children. Special care may be needed. Overdosage: If you think you have taken too much of this medicine contact a poison control center or emergency room at once. NOTE: This medicine is only for you. Do not share this medicine with others. What if I miss a dose? If you miss a dose, take it as soon as you can. If it is almost time for your next dose, take only that dose. Do not take double or extra doses. What may interact with this medicine? Do not take this medicine with any of the following medications: -certain  medicines for HIV infection or AIDS -ketoconazole -itraconazole This medicine may also interact with the following medications: -birth control pills -certain macrolide antibiotics like clarithromycin, erythromycin, troleandomycin -cimetidine -cyclosporine -ergotamine -grapefruit juice -herbal or dietary supplements like kava kava, melatonin, dehydroepiandrosterone, DHEA, St. John's Wort or valerian -imatinib, STI-571 -isoniazid -levodopa -medicines for depression, anxiety, or psychotic disturbances -prescription pain medicines -rifampin, rifapentine, or rifabutin -some medicines for blood pressure or heart problems -some medicines for seizures like carbamazepine, oxcarbazepine, phenobarbital, phenytoin, primidone This list may not describe all possible interactions. Give your health care provider a list of all the medicines, herbs, non-prescription drugs, or dietary supplements you use. Also tell them if you smoke, drink alcohol, or use illegal drugs. Some items may interact with your medicine. What should I watch for while using this medicine? Visit your doctor or health care professional for regular checks on your progress. Your body can become dependent on this medicine. Ask your doctor or health care professional if you still need to take it. You may get drowsy or dizzy. Do not drive, use machinery, or do anything that needs mental alertness until you know how this medicine affects you. To reduce the risk of dizzy and fainting spells, do not stand or sit up quickly, especially if you are an older patient. Alcohol may increase dizziness and drowsiness. Avoid alcoholic drinks. Do not treat yourself for coughs, colds or allergies without asking your doctor or health care professional for advice. Some ingredients can increase possible side effects. What side effects may I notice from receiving this medicine? Side effects that you should report to your   doctor or health care professional as  soon as possible: -allergic reactions like skin rash, itching or hives, swelling of the face, lips, or tongue -confusion, forgetfulness -depression -difficulty sleeping -difficulty speaking -feeling faint or lightheaded, falls -mood changes, excitability or aggressive behavior -muscle cramps -trouble passing urine or change in the amount of urine -unusually weak or tired Side effects that usually do not require medical attention (report to your doctor or health care professional if they continue or are bothersome): -change in sex drive or performance -changes in appetite This list may not describe all possible side effects. Call your doctor for medical advice about side effects. You may report side effects to FDA at 1-800-FDA-1088. Where should I keep my medicine? Keep out of the reach of children. This medicine can be abused. Keep your medicine in a safe place to protect it from theft. Do not share this medicine with anyone. Selling or giving away this medicine is dangerous and against the law. Store at room temperature between 20 and 25 degrees C (68 and 77 degrees F). Throw away any unused medicine after the expiration date. NOTE: This sheet is a summary. It may not cover all possible information. If you have questions about this medicine, talk to your doctor, pharmacist, or health care provider.  2013, Elsevier/Gold Standard. (02/07/2008 10:34:46 AM)

## 2013-07-17 ENCOUNTER — Encounter: Payer: Self-pay | Admitting: Gynecologic Oncology

## 2013-07-22 ENCOUNTER — Telehealth: Payer: Self-pay | Admitting: *Deleted

## 2013-07-22 NOTE — Telephone Encounter (Signed)
Called patient to inform of appt. Change for 07-25-13 due to Dr. Roselind Messier  Doing an HDR Case, spoke with patient and she is aware of this appt. change

## 2013-07-25 ENCOUNTER — Ambulatory Visit
Admission: RE | Admit: 2013-07-25 | Discharge: 2013-07-25 | Disposition: A | Discharge status: Home / Self Care | Payer: Medicare PPO | Source: Ambulatory Visit | Attending: Radiation Oncology | Admitting: Radiation Oncology

## 2013-07-25 ENCOUNTER — Encounter: Payer: Self-pay | Admitting: Radiation Oncology

## 2013-07-25 ENCOUNTER — Ambulatory Visit: Payer: Medicare PPO | Admitting: Radiation Oncology

## 2013-07-25 ENCOUNTER — Other Ambulatory Visit (HOSPITAL_COMMUNITY)
Admission: RE | Admit: 2013-07-25 | Discharge: 2013-07-25 | Disposition: A | Discharge status: Home / Self Care | Payer: Medicare PPO | Source: Ambulatory Visit | Attending: Radiation Oncology | Admitting: Radiation Oncology

## 2013-07-25 VITALS — BP 121/76 | HR 83 | Temp 98.5°F | Resp 20 | Wt 256.4 lb

## 2013-07-25 DIAGNOSIS — C541 Malignant neoplasm of endometrium: Secondary | ICD-10-CM

## 2013-07-25 DIAGNOSIS — Z124 Encounter for screening for malignant neoplasm of cervix: Secondary | ICD-10-CM | POA: Insufficient documentation

## 2013-07-25 NOTE — Progress Notes (Signed)
Radiation Oncology         [phone] ________________________________  Name: [name] MRN: [id number]  Date: 07/25/2013  DOB: [birth year]  Follow-Up Visit Note  CC: METHENEY,CATHERINE, MD  Reece Packer, MD  Diagnosis:   Endometrial cancer  Interval Since Last Radiation:  2 years and 2 months   Narrative:  The patient returns today for routine follow-up.  She denies any vaginal bleeding pelvic pain urination difficulties or bowel complaints. She has had a lot of for stressors in her life recently and was admitted to the hospital recently with chest pain.  She was seen by gynecologic oncology approximately 4 months ago with a good report.                              ALLERGIES:  has No Known Allergies.  Meds: Current Outpatient Prescriptions  Medication Sig Dispense Refill  . ALPRAZolam (XANAX) 0.25 MG tablet Take 1 tablet (0.25 mg total) by mouth daily as needed for anxiety.  15 tablet  0  . buPROPion (WELLBUTRIN XL) 150 MG 24 hr tablet Take 1 tablet (150 mg total) by mouth every morning.  30 tablet  0  . Cholecalciferol (VITAMIN D3) 1000 UNITS CAPS Take 2,000 Units by mouth daily.        . Ferrous Gluconate (IRON) 240 (27 FE) MG TABS Take 1 tablet by mouth daily.      Marland Kitchen FLUoxetine (PROZAC) 20 MG capsule Take 60 mg by mouth daily.       . furosemide (LASIX) 20 MG tablet Take 1 tablet (20 mg total) by mouth daily.  90 tablet  1  . ibuprofen (ADVIL,MOTRIN) 600 MG tablet Take 1 tablet (600 mg total) by mouth every 8 (eight) hours as needed for pain.  30 tablet  1  . Multiple Vitamin (MULTIVITAMIN) capsule Take 1 capsule by mouth daily.        . pantoprazole (PROTONIX) 40 MG tablet Take 1 tablet (40 mg total) by mouth daily.  30 tablet  3  . potassium chloride (K-DUR) 10 MEQ tablet Take 1 tablet (10 mEq total) by mouth daily as needed. Take when you take the lasix  90 tablet  0  . senna-docusate (SENOKOT-S) 8.6-50 MG per tablet Take 1 tablet by mouth daily as needed. For  constipation      . traZODone (DESYREL) 50 MG tablet Take 1-3 tablets (50-150 mg total) by mouth at bedtime as needed for sleep.  90 tablet  0  . vitamin B-12 (CYANOCOBALAMIN) 1000 MCG tablet Take 1,000 mcg by mouth daily.         No current facility-administered medications for this encounter.    Physical Findings: The patient is in no acute distress. Patient is alert and oriented.  weight is 256 lb 6.4 oz (116.302 kg). Her temperature is 98.5 F (36.9 C). Her blood pressure is 121/76 and her pulse is 83. Her respiration is 20. Marland Kitchen No palpable supraclavicular adenopathy. The lungs are clear to auscultation. The heart has regular rhythm and rate. The abdomen is soft and nontender with normal bowel sounds. The inguinal areas are free of adenopathy. Patient does have a yeast infection in the skin folds of her lower abdominal region and she will restart her antifungal powder which was given to her in the hospital. On pelvic examination the external genitalia are unremarkable. A speculum exam is performed. There is some mild radiation changes noted in the  proximal vagina. There are no mucosal lesions noted. A Pap smear was obtained of the proximal vagina. On bimanual and rectovaginal examination there no pelvic masses appreciated.  Lab Findings: Lab Results  Component Value Date   WBC 5.0 07/15/2013   HGB 10.5* 07/15/2013   HCT 32.7* 07/15/2013   MCV 92.4 07/15/2013   PLT 150 07/15/2013      Radiographic Findings: Dg Chest 2 View  07/12/2013   CLINICAL DATA:  Chest pain. Shortness of breath.  EXAM: CHEST  2 VIEW  COMPARISON:  05/08/2013.  FINDINGS: Low lung volumes are present, causing crowding of the pulmonary vasculature. Loop recorder noted. Mild cardiomegaly.  Airway thickening is present, suggesting bronchitis or reactive airways disease. Thoracic spondylosis noted.  IMPRESSION: 1. Airway thickening is present, suggesting bronchitis or reactive airways disease. 2. Mild cardiomegaly, without  edema. 3. Low lung volumes.   Electronically Signed   By: Herbie Baltimore   On: 07/12/2013 14:34   Mm Digital Screening  07/15/2013   *RADIOLOGY REPORT*  Clinical Data: Screening.  DIGITAL SCREENING BILATERAL MAMMOGRAM WITH CAD  Comparison:  Previous exam(s).  FINDINGS:  ACR Breast Density Category b:  There are scattered areas of fibroglandular density.  There are no findings suspicious for malignancy.  Images were processed with CAD.  IMPRESSION: No mammographic evidence of malignancy.  A result letter of this screening mammogram will be mailed directly to the patient.  RECOMMENDATION: Screening mammogram in one year. (Code:SM-B-01Y)  BI-RADS CATEGORY 1:  Negative.   Original Report Authenticated By: Beckie Salts, M.D.    Impression:  No evidence recurrence on clinical exam today, Pap smear pending  Plan:  Routine followup in 8 months. Patient will be seen by gynecologic oncology in 4 months  _____________________________________  -----------------------------------  Billie Lade, PhD, MD

## 2013-07-25 NOTE — Progress Notes (Signed)
Pt denies pain, urinary, bowel issues, vaginal discharge, fatigue, loss of appetite. Pt using vaginal dilator 3 x weekly. Pt states she was in hospital x 4 days 2 weeks ago, and diagnosed w/"unstable angina".

## 2013-07-30 ENCOUNTER — Telehealth: Payer: Self-pay | Admitting: Internal Medicine

## 2013-07-30 ENCOUNTER — Ambulatory Visit (INDEPENDENT_AMBULATORY_CARE_PROVIDER_SITE_OTHER): Payer: Medicare PPO | Admitting: Family Medicine

## 2013-07-30 ENCOUNTER — Encounter: Payer: Self-pay | Admitting: Family Medicine

## 2013-07-30 VITALS — BP 123/67 | HR 81 | Wt 256.0 lb

## 2013-07-30 DIAGNOSIS — Z23 Encounter for immunization: Secondary | ICD-10-CM

## 2013-07-30 DIAGNOSIS — F430 Acute stress reaction: Secondary | ICD-10-CM

## 2013-07-30 NOTE — Telephone Encounter (Signed)
Spoke w/ pt and answered questions.

## 2013-07-30 NOTE — Progress Notes (Signed)
  Subjective:    Patient ID: [name], female    DOB: 1946-07-23, 67 y.o.   MRN: [id number]  HPI Acute stress disorder-here to followup. She was going through a very stressful. We have decided to add Wellbutrin to her fluoxetine to CP get her symptoms under better control. 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 She was also looking into doing some counseling. 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  Has had to use the xanax twice. Says feels the wellbutrin has been helping. Wants to keep it the same.  Not sleepig as well. Has been hiaving to use her trazodone more often than usual.   + fatigue, + lack of focus, + moves or speaking slowly, +feelng down, +sleep issues.   Review of Systems     Objective:   Physical Exam  Constitutional: She is oriented to person, place, and time. She appears well-developed.  HENT:  Head: Normocephalic and atraumatic.  Neurological: She is alert and oriented to person, place, and time.  Skin: Skin is warm and dry.  Psychiatric: She has a normal mood and affect. Her behavior is normal.          Assessment & Plan:  Acute stress d/o - PHQ socre of 19. She feels the meds are helping.  She plans on leaving town tomorrow to go stay with her son in [place] for 2 weeks. Her husband is going with her but her daughter-in-law has offered to really help take care of him and give her a break for the next couple weeks. I think this would be fantastic for her mentally. I still encouraged her to get into counseling when she does get back into town. Right now we will keep her medication regimen where it sat. Continue to use the Xanax very sparingly and reminded her again about the potential for addiction. Followup in one month.  Flu shot given.

## 2013-07-30 NOTE — Telephone Encounter (Signed)
Pt going out of town for two weeks, dos she need to take the transmitter with her for her loop recorder??

## 2013-07-31 ENCOUNTER — Telehealth: Payer: Self-pay | Admitting: *Deleted

## 2013-07-31 ENCOUNTER — Encounter: Payer: Self-pay | Admitting: Gynecologic Oncology

## 2013-07-31 NOTE — Telephone Encounter (Signed)
Per Dr Roselind Messier, informed pt her 07/25/13 pap smear results are normal. Pt verbalized thanks and understanding.

## 2013-08-06 ENCOUNTER — Ambulatory Visit: Payer: Medicare PPO | Admitting: Family Medicine

## 2013-08-06 ENCOUNTER — Encounter: Payer: Medicare PPO | Admitting: Internal Medicine

## 2013-08-06 DIAGNOSIS — Z0289 Encounter for other administrative examinations: Secondary | ICD-10-CM

## 2013-08-09 ENCOUNTER — Ambulatory Visit (INDEPENDENT_AMBULATORY_CARE_PROVIDER_SITE_OTHER): Payer: Medicare PPO | Admitting: *Deleted

## 2013-08-09 DIAGNOSIS — R55 Syncope and collapse: Secondary | ICD-10-CM

## 2013-08-15 ENCOUNTER — Other Ambulatory Visit: Payer: Self-pay | Admitting: Family Medicine

## 2013-08-24 LAB — PACEMAKER DEVICE OBSERVATION

## 2013-08-28 ENCOUNTER — Encounter: Payer: Self-pay | Admitting: *Deleted

## 2013-08-29 ENCOUNTER — Encounter: Payer: Self-pay | Admitting: Family Medicine

## 2013-08-29 ENCOUNTER — Ambulatory Visit (INDEPENDENT_AMBULATORY_CARE_PROVIDER_SITE_OTHER): Payer: Medicare PPO | Admitting: Family Medicine

## 2013-08-29 VITALS — BP 132/72 | HR 77 | Wt 252.0 lb

## 2013-08-29 DIAGNOSIS — K5792 Diverticulitis of intestine, part unspecified, without perforation or abscess without bleeding: Secondary | ICD-10-CM

## 2013-08-29 DIAGNOSIS — R05 Cough: Secondary | ICD-10-CM

## 2013-08-29 DIAGNOSIS — K5732 Diverticulitis of large intestine without perforation or abscess without bleeding: Secondary | ICD-10-CM

## 2013-08-29 DIAGNOSIS — F32A Depression, unspecified: Secondary | ICD-10-CM

## 2013-08-29 DIAGNOSIS — R1032 Left lower quadrant pain: Secondary | ICD-10-CM

## 2013-08-29 DIAGNOSIS — J069 Acute upper respiratory infection, unspecified: Secondary | ICD-10-CM

## 2013-08-29 DIAGNOSIS — R059 Cough, unspecified: Secondary | ICD-10-CM

## 2013-08-29 DIAGNOSIS — F329 Major depressive disorder, single episode, unspecified: Secondary | ICD-10-CM

## 2013-08-29 MED ORDER — CIPROFLOXACIN HCL 500 MG PO TABS: 500.0000 mg | ORAL_TABLET | Freq: Two times a day (BID) | ORAL | Status: AC

## 2013-08-29 MED ORDER — METRONIDAZOLE 500 MG PO TABS
500.0000 mg | ORAL_TABLET | Freq: Three times a day (TID) | ORAL | Status: AC
Start: 2013-08-29 — End: ?

## 2013-08-29 MED ORDER — BUPROPION HCL ER (XL) 150 MG PO TB24: ORAL_TABLET | ORAL | Status: DC

## 2013-08-29 MED ORDER — IBUPROFEN 600 MG PO TABS: 600.0000 mg | ORAL_TABLET | Freq: Three times a day (TID) | ORAL | Status: AC | PRN

## 2013-08-29 NOTE — Patient Instructions (Addendum)
Please call if you are getting worse

## 2013-08-29 NOTE — Progress Notes (Signed)
Subjective:    Patient ID: [name], female    DOB: 1946/04/05, 67 y.o.   MRN: [id number]  HPI Follow-up pt reports that she will soon move where her son is in [place]. She leaving spet 26.  Needs Medication Refill  pt is asking for 60 day refill on bupropion and IBU.  Her husband is getting more ill.    Anxiety - Mood if fair. Has been really stressed.  HAs not support and help here.  On wellbutrin and prozac.  She does complain of feeling tired all the time. She feels bad about herself and has trouble concentrating. She goes down more than half the days. She feels like she cannot control her worrying.  She's been more irritable and easily annoyed and has a hard time sitting still.  6 days of cough that is productive. No nasal congestion. + HA.  No ST.  Has felt SOB with the cough. No swelling.  Voice has been really hoarse.  No fever or GI sxs. No recent ABS. FOrmer smoker.    She says she also wants me to listen to her stomach today. She's had a little bit of twinge of pain in the left lower quadrant as well as a couple loose stools. No fevers chills or sweats. She has a history of diverticulitis and her last episode was about a year ago. She does want to make sure that everything was okay. No nausea.  Review of Systems     Objective:   Physical Exam  Constitutional: She is oriented to person, place, and time. She appears well-developed and well-nourished.  HENT:  Head: Normocephalic and atraumatic.  Right Ear: External ear normal.  Left Ear: External ear normal.  Nose: Nose normal.  Mouth/Throat: Oropharynx is clear and moist.  TMs and canals are clear.   Eyes: Conjunctivae and EOM are normal. Pupils are equal, round, and reactive to light.  Neck: Neck supple. No thyromegaly present.  Cardiovascular: Normal rate, regular rhythm and normal heart sounds.   Pulmonary/Chest: Effort normal and breath sounds normal. She has no wheezes.  Abdominal: Soft. Bowel sounds are normal. She  exhibits no distension and no mass. There is tenderness. There is no rebound and no guarding.  Tender in the entire left side of the abdomen and especially tender in the left lower quadrant. No masses. No guarding.  Lymphadenopathy:    She has no cervical adenopathy.  Neurological: She is alert and oriented to person, place, and time.  Skin: Skin is warm and dry.  Psychiatric: She has a normal mood and affect.          Assessment & Plan:  Anxiety - GAD-7 score of 15 and PHQ 9 score of 15. We will continue the fluoxetine and the Wellbutrin.  Acute diverticulitis-strongly suspect diverticulitis based on tenderness and recent onset of diarrhea. I will go ahead and place her on antibiotics and a CBC today. If she suddenly gets worse I encouraged her to the emergency department immediately. Reminded her that there is a risk of abscess and perforation. She has not had a fever. She's also to start a liquid diet for bowel rest. After 2-3 days if she's feeling better her pain is subsiding and she is still afebrile and she can advance her diet as tolerated. If she feels she's getting a little worse overnight she's to come in tomorrow to schedule her for a CT. If her pain gets severe, she vomits or she spikes a fever, and she  needs to go to the emergency department overnight.  Upper respiratory infection-most consistent with acute bronchitis. Likely viral. She has had symptoms for 6-7 days at this point in time. Call if not improving after the weekend.

## 2013-08-30 LAB — CBC WITH DIFFERENTIAL/PLATELET
Basophils Absolute: 0 K/uL (ref 0.0–0.1)
Basophils Relative: 0 % (ref 0–1)
Eosinophils Absolute: 0.2 K/uL (ref 0.0–0.7)
Eosinophils Relative: 4 % (ref 0–5)
HCT: 34.3 % — ABNORMAL LOW (ref 36.0–46.0)
Hemoglobin: 11.1 g/dL — ABNORMAL LOW (ref 12.0–15.0)
Lymphocytes Relative: 30 % (ref 12–46)
Lymphs Abs: 1.5 K/uL (ref 0.7–4.0)
MCH: 29.4 pg (ref 26.0–34.0)
MCHC: 32.4 g/dL (ref 30.0–36.0)
MCV: 91 fL (ref 78.0–100.0)
Monocytes Absolute: 0.4 K/uL (ref 0.1–1.0)
Monocytes Relative: 7 % (ref 3–12)
Neutro Abs: 3 K/uL (ref 1.7–7.7)
Neutrophils Relative %: 59 % (ref 43–77)
Platelets: 195 K/uL (ref 150–400)
RBC: 3.77 MIL/uL — ABNORMAL LOW (ref 3.87–5.11)
RDW: 17.2 % — ABNORMAL HIGH (ref 11.5–15.5)
WBC: 5.1 K/uL (ref 4.0–10.5)

## 2013-08-30 LAB — COMPLETE METABOLIC PANEL WITH GFR
AST: 12 U/L (ref 0–37)
Albumin: 4 g/dL (ref 3.5–5.2)
Alkaline Phosphatase: 84 U/L (ref 39–117)
Potassium: 4.2 mEq/L (ref 3.5–5.3)
Sodium: 139 mEq/L (ref 135–145)
Total Protein: 6.9 g/dL (ref 6.0–8.3)

## 2013-09-04 ENCOUNTER — Encounter: Payer: Self-pay | Admitting: Internal Medicine

## 2013-09-07 ENCOUNTER — Other Ambulatory Visit: Payer: Self-pay | Admitting: Family Medicine

## 2013-09-12 ENCOUNTER — Ambulatory Visit: Payer: Medicare PPO | Admitting: Family Medicine

## 2013-09-12 DIAGNOSIS — Z0289 Encounter for other administrative examinations: Secondary | ICD-10-CM

## 2013-09-24 ENCOUNTER — Encounter: Payer: Medicare PPO | Admitting: Internal Medicine

## 2013-10-09 ENCOUNTER — Encounter: Payer: Self-pay | Admitting: Internal Medicine

## 2013-10-14 ENCOUNTER — Ambulatory Visit (INDEPENDENT_AMBULATORY_CARE_PROVIDER_SITE_OTHER): Payer: Medicare PPO | Admitting: *Deleted

## 2013-10-14 DIAGNOSIS — R55 Syncope and collapse: Secondary | ICD-10-CM

## 2013-10-17 ENCOUNTER — Encounter: Payer: Self-pay | Admitting: Internal Medicine

## 2013-10-18 LAB — MDC_IDC_ENUM_SESS_TYPE_REMOTE

## 2013-10-31 ENCOUNTER — Ambulatory Visit: Payer: Medicare PPO | Admitting: Gynecologic Oncology

## 2013-11-13 ENCOUNTER — Ambulatory Visit (INDEPENDENT_AMBULATORY_CARE_PROVIDER_SITE_OTHER): Payer: Medicare HMO | Admitting: *Deleted

## 2013-11-13 DIAGNOSIS — R55 Syncope and collapse: Secondary | ICD-10-CM

## 2013-11-22 ENCOUNTER — Encounter: Payer: Medicare PPO | Admitting: Cardiology

## 2013-11-22 NOTE — Progress Notes (Signed)
HPI:FU syncope. Echocardiogram in October of 2013 showed an ejection fraction of 65-70%, grade 2 diastolic dysfunction, mild biatrial enlargement and mild aortic stenosis. TR velocity was 2.5 m/s. Lower extremity Dopplers in November of 2013 showed no DVT. CardioNet performed for near syncope showed sinus rhythm. Nuclear study in May of 2014 showed an ejection fraction of 77% and normal perfusion. DDimer 0.51. She had an implantable loop recorder. Cardiac catheterization in August of 2014 showed normal coronaries and normal LV function. Since she was last seen,    Current Outpatient Prescriptions  Medication Sig Dispense Refill  . ALPRAZolam (XANAX) 0.25 MG tablet Take 1 tablet (0.25 mg total) by mouth daily as needed for anxiety.  15 tablet  0  . Cholecalciferol (VITAMIN D3) 1000 UNITS CAPS Take 2,000 Units by mouth daily.        . Ferrous Gluconate (IRON) 240 (27 FE) MG TABS Take 1 tablet by mouth daily.      Marland Kitchen FLUoxetine (PROZAC) 20 MG capsule Take 3 capsules (60 mg total) by mouth daily.  90 capsule  1  . furosemide (LASIX) 20 MG tablet TAKE ONE TABLET BY MOUTH ONCE DAILY  90 tablet  0  . ibuprofen (ADVIL,MOTRIN) 600 MG tablet Take 1 tablet (600 mg total) by mouth every 8 (eight) hours as needed for pain.  90 tablet  1  . KLOR-CON M10 10 MEQ tablet TAKE ONE TABLET BY MOUTH ONCE DAILY AS NEEDED. TAKE WHEN YOU TAKE THE LASIX.  90 tablet  0  . metroNIDAZOLE (FLAGYL) 500 MG tablet Take 1 tablet (500 mg total) by mouth 3 (three) times daily.  30 tablet  0  . Multiple Vitamin (MULTIVITAMIN) capsule Take 1 capsule by mouth daily.        . pantoprazole (PROTONIX) 40 MG tablet Take 1 tablet (40 mg total) by mouth daily.  30 tablet  3  . senna-docusate (SENOKOT-S) 8.6-50 MG per tablet Take 1 tablet by mouth daily as needed. For constipation      . traZODone (DESYREL) 50 MG tablet Take 1-3 tablets (50-150 mg total) by mouth at bedtime as needed for sleep.  90 tablet  0  . vitamin B-12  (CYANOCOBALAMIN) 1000 MCG tablet Take 1,000 mcg by mouth daily.        . [DISCONTINUED] potassium chloride (K-DUR) 10 MEQ tablet Take 1 tablet (10 mEq total) by mouth daily as needed. Take when you take the lasix  90 tablet  0   No current facility-administered medications for this visit.     Past Medical History  Diagnosis Date  . Back pain   . Diverticulosis April 2012  . Depression   . Hx of radiation therapy 03/08/11-04/18/11& 5/29,6/04/03/11/12    external beam and intracavity brachytherapy  . Anxiety   . Obesity   . Anemia     multifactorial,r/t chemo and radiation also  . History of mammogram 06/14/11    b/l mammogram  . Cataract     "right one is still there" (07/12/2013)  . Mild aortic stenosis     a. 08/2012 Echo: EF 65-70%, Gr 2 DD, mild biatrial enlargement, mild AS.  Marland Kitchen Abnormal ECG     a. 03/2013 cardiolite: EF 77%, normal perfusion.  . Syncope     a. cardionet showed sinus rhythm in setting of near syncope;  b. 03/2013: s/p ILR  . Uterine cancer 2011    "advanced; Dr. Duard Brady" (07/12/2013)  . Heart murmur   . Chest pain at  rest     "just today" (07/12/2013)  . History of blood transfusion ?2012    "when I was in treatment for cancer" (07/12/2013  . GERD (gastroesophageal reflux disease)   . Arthritis     degenerative; "fingers; lower back" (07/12/2013)    Past Surgical History  Procedure Laterality Date  . Portacath placement Right 11/2010  . Cataract extraction w/phaco Left 03/21/2013    Dr. Hardie Shackleton  . Port-a-cath removal Right 2013?  Marland Kitchen Loop recorder implant  03/2013  . Total abdominal hysterectomy  10/2010    History   Social History  . Marital Status: Married    Spouse Name: N/A    Number of Children: 1  . 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 Smoking status: Former Smoker -- 1.00 packs/day for 12 years    Types: Cigarettes  . Smokeless tobacco: Never Used     Comment: 07/12/2013 quit smoking  40+ yrs ago  . 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 Drug Use: No  . Sexual Activity: No   Other Topics Concern  . Not on file   Social History Narrative   Lives in [place] with husband, who is disabled.  She does not routinely exercise.  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    ROS: no fevers or chills, productive cough, hemoptysis, dysphasia, odynophagia, melena, hematochezia, dysuria, hematuria, rash, seizure activity, orthopnea, PND, pedal edema, claudication. Remaining systems are negative.  Physical Exam: Well-developed well-nourished in no acute distress.  Skin is warm and dry.  HEENT is normal.  Neck is supple.  Chest is clear to auscultation with normal expansion.  Cardiovascular exam is regular rate and rhythm.  Abdominal exam nontender or distended. No masses palpated. Extremities show no edema. neuro grossly intact  ECG     This encounter was created in error - please disregard.

## 2013-11-25 ENCOUNTER — Encounter: Payer: Self-pay | Admitting: Internal Medicine

## 2013-12-13 ENCOUNTER — Ambulatory Visit (INDEPENDENT_AMBULATORY_CARE_PROVIDER_SITE_OTHER): Payer: Medicare HMO | Admitting: *Deleted

## 2013-12-13 DIAGNOSIS — R55 Syncope and collapse: Secondary | ICD-10-CM

## 2014-01-01 LAB — MDC_IDC_ENUM_SESS_TYPE_REMOTE

## 2014-01-14 ENCOUNTER — Ambulatory Visit (INDEPENDENT_AMBULATORY_CARE_PROVIDER_SITE_OTHER): Payer: Self-pay | Admitting: *Deleted

## 2014-01-14 DIAGNOSIS — R55 Syncope and collapse: Secondary | ICD-10-CM

## 2014-01-17 ENCOUNTER — Encounter: Payer: Self-pay | Admitting: Cardiology

## 2014-01-29 ENCOUNTER — Encounter: Payer: Self-pay | Admitting: Internal Medicine

## 2014-02-14 ENCOUNTER — Ambulatory Visit (INDEPENDENT_AMBULATORY_CARE_PROVIDER_SITE_OTHER): Payer: Self-pay | Admitting: *Deleted

## 2014-02-14 DIAGNOSIS — R55 Syncope and collapse: Secondary | ICD-10-CM

## 2014-02-14 LAB — MDC_IDC_ENUM_SESS_TYPE_REMOTE

## 2014-03-17 ENCOUNTER — Ambulatory Visit (INDEPENDENT_AMBULATORY_CARE_PROVIDER_SITE_OTHER): Payer: Self-pay | Admitting: *Deleted

## 2014-03-17 DIAGNOSIS — R55 Syncope and collapse: Secondary | ICD-10-CM

## 2014-03-17 LAB — MDC_IDC_ENUM_SESS_TYPE_REMOTE
Date Time Interrogation Session: 20150514131732
MDC IDC SET ZONE DETECTION INTERVAL: 2000 ms
Zone Setting Detection Interval: 3000 ms
Zone Setting Detection Interval: 370 ms

## 2014-03-27 ENCOUNTER — Ambulatory Visit: Payer: Medicare PPO | Attending: Radiation Oncology | Admitting: Radiation Oncology

## 2014-04-03 ENCOUNTER — Encounter: Payer: Self-pay | Admitting: Internal Medicine

## 2014-04-09 ENCOUNTER — Encounter: Payer: Self-pay | Admitting: *Deleted

## 2014-04-10 ENCOUNTER — Ambulatory Visit (INDEPENDENT_AMBULATORY_CARE_PROVIDER_SITE_OTHER): Payer: Self-pay | Admitting: *Deleted

## 2014-04-10 DIAGNOSIS — R55 Syncope and collapse: Secondary | ICD-10-CM

## 2014-04-11 ENCOUNTER — Encounter: Payer: Self-pay | Admitting: *Deleted

## 2014-04-17 ENCOUNTER — Encounter: Payer: Self-pay | Admitting: *Deleted

## 2014-04-17 ENCOUNTER — Encounter: Payer: Self-pay | Admitting: Internal Medicine

## 2014-04-17 DIAGNOSIS — R55 Syncope and collapse: Secondary | ICD-10-CM

## 2014-04-25 IMAGING — US US PELVIS COMPLETE
1 series · 14 of 25 positions shown · non-contrast
Comparison: None

CLINICAL DATA: Vaginal bleeding.  History of hysterectomy.

TRANSABDOMINAL AND TRANSVAGINAL ULTRASOUND OF PELVIS
TECHNIQUE: Both transabdominal and transvaginal ultrasound
examinations of the pelvis were performed. Transabdominal technique
was performed for global imaging of the pelvis including uterus,
ovaries, adnexal regions, and pelvic cul-de-sac.
It was necessary to proceed with endovaginal exam following the
transabdominal exam to visualize the remaining pelvic structures in
greater detail.

[Series 1: us pelvis complete · 0.35mm/px · 14 of 68 slices shown]
[im 1/68]
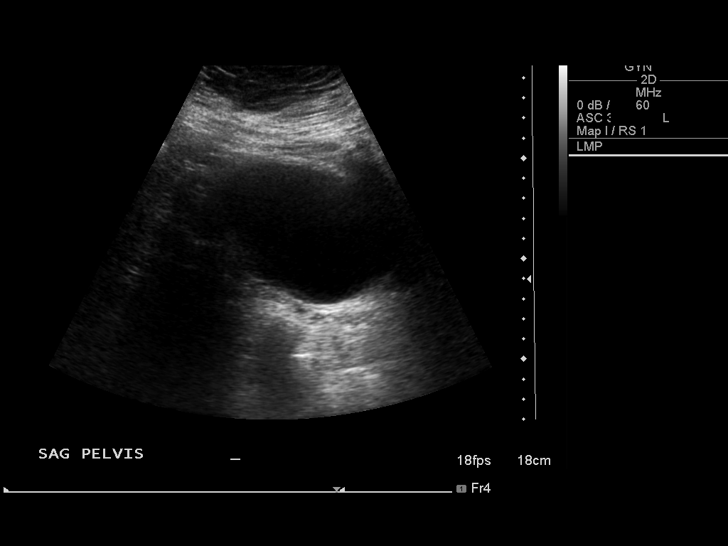
[im 6/68]
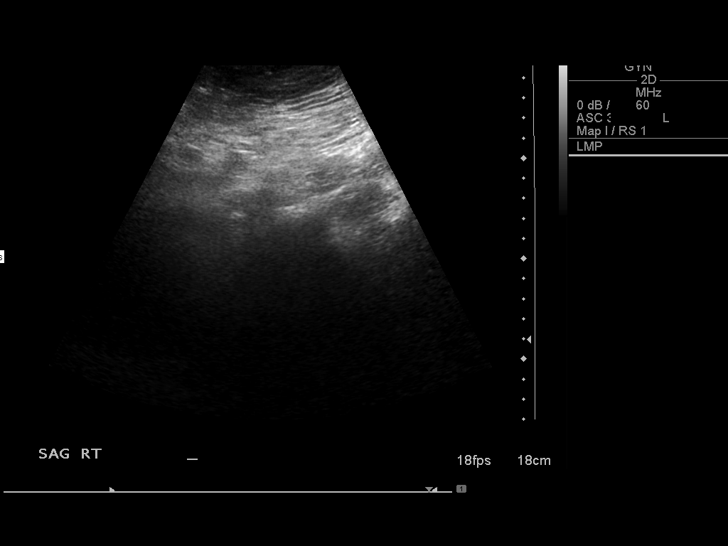
[im 12/68]
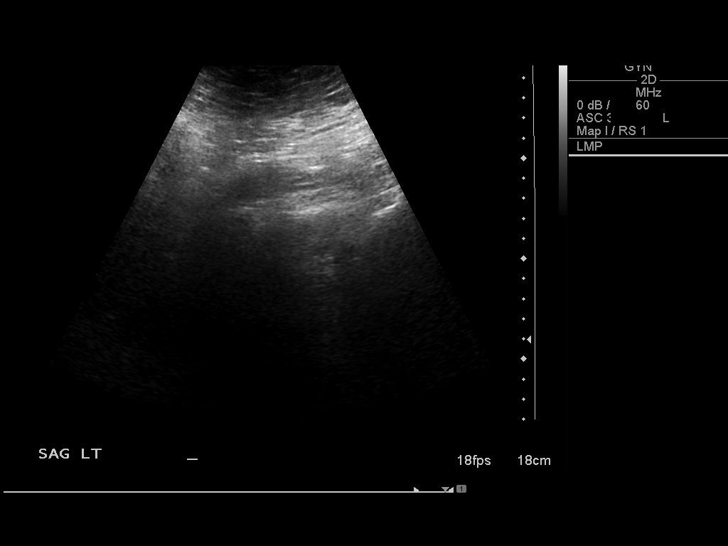
[im 17/68]
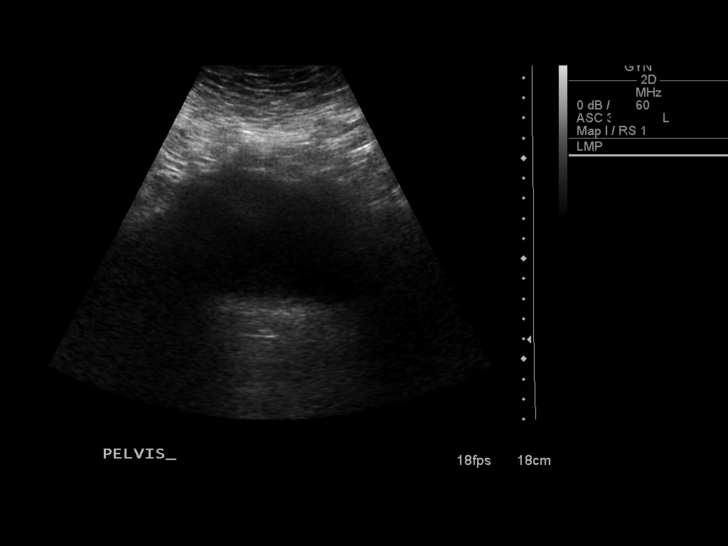
[im 23/68]
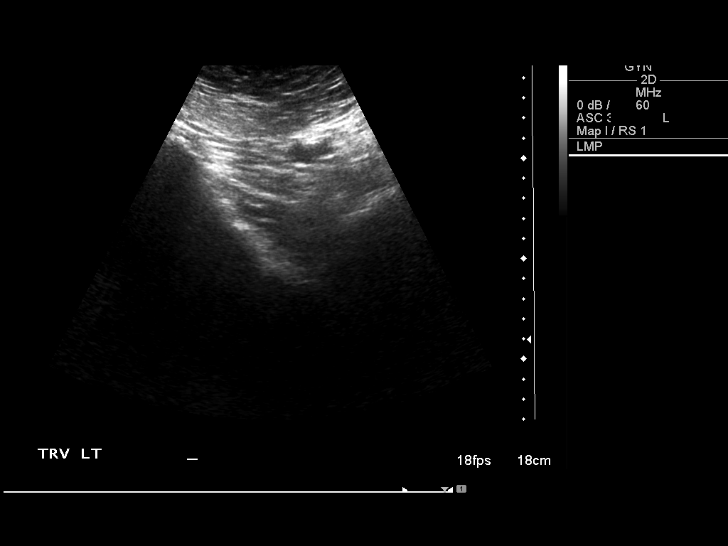
[im 26/68]
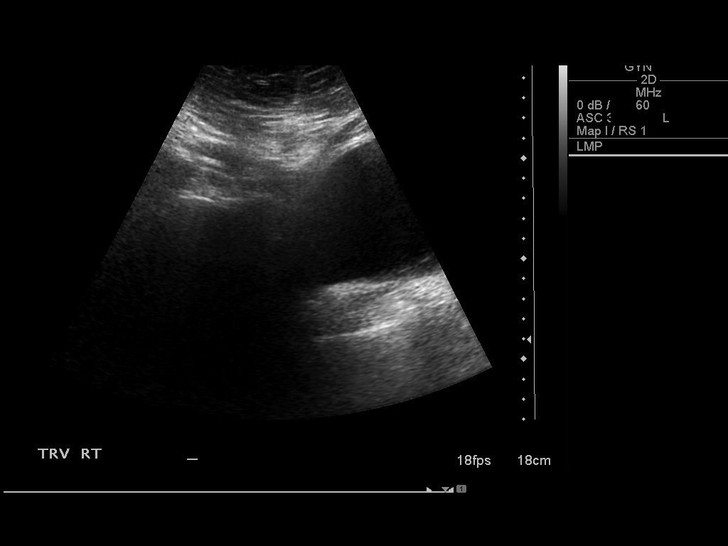
[im 31/68]
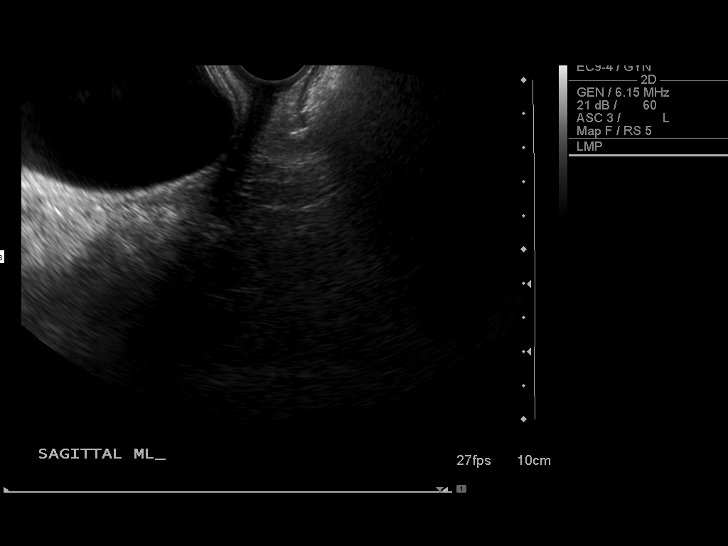
[im 37/68]
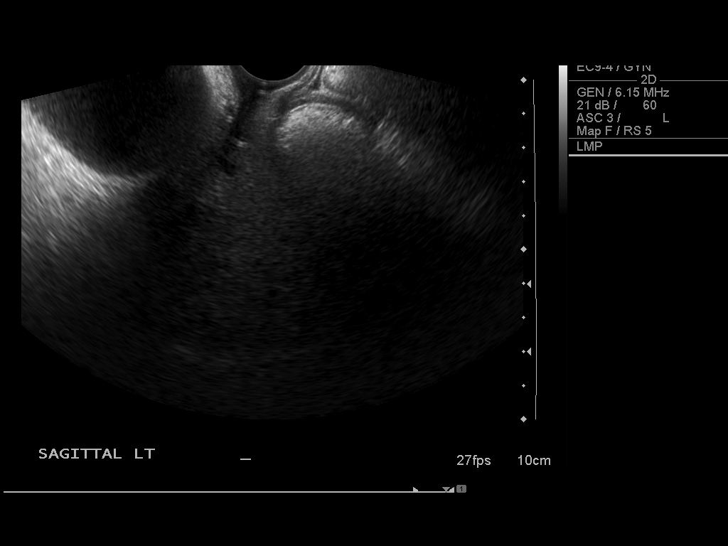
[im 42/68]
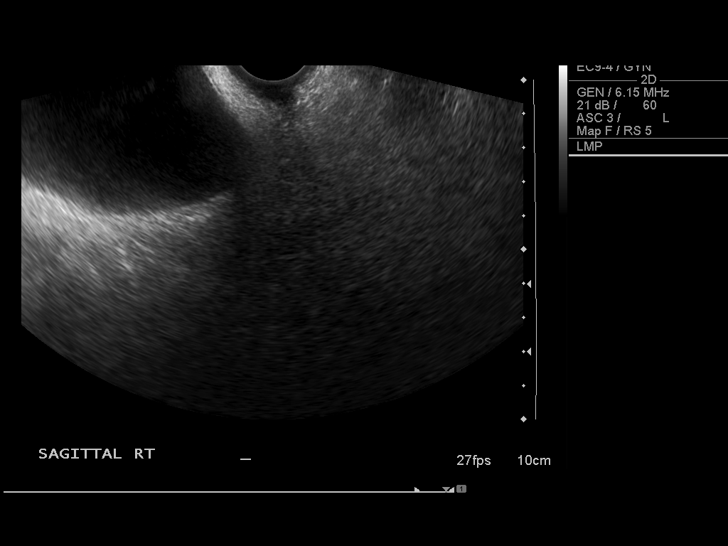
[im 45/68]
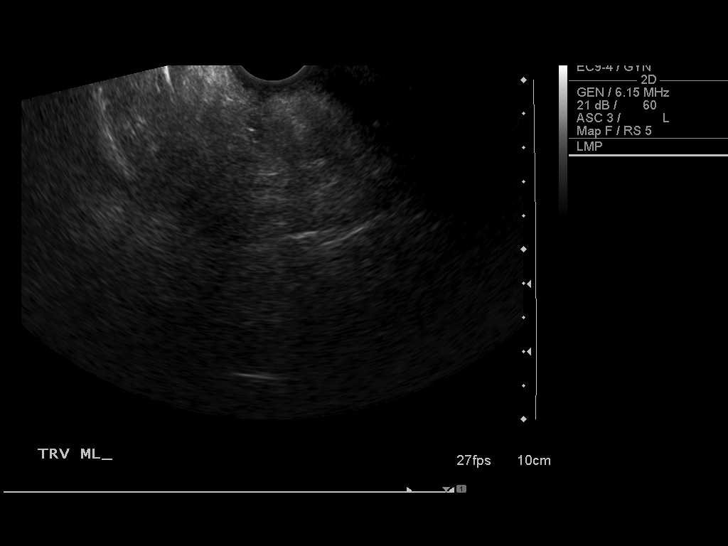
[im 51/68]
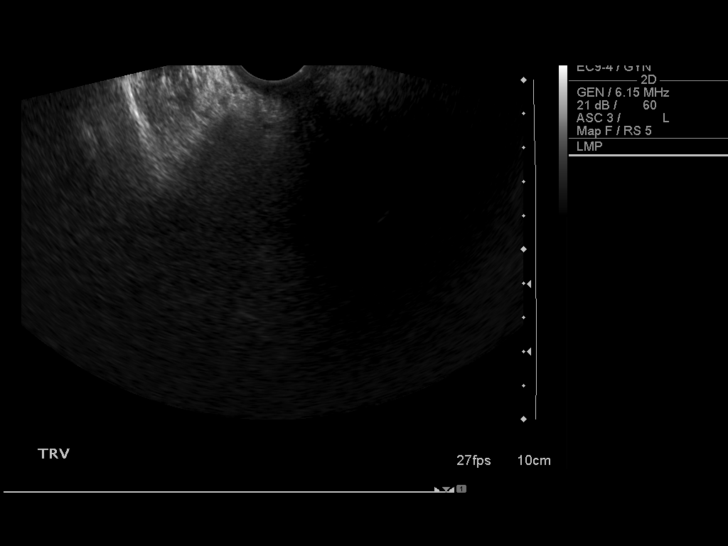
[im 56/68]
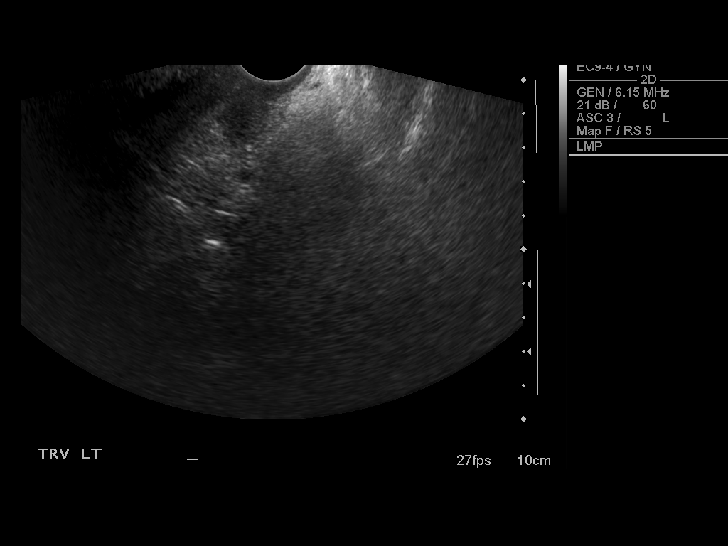
[im 62/68]
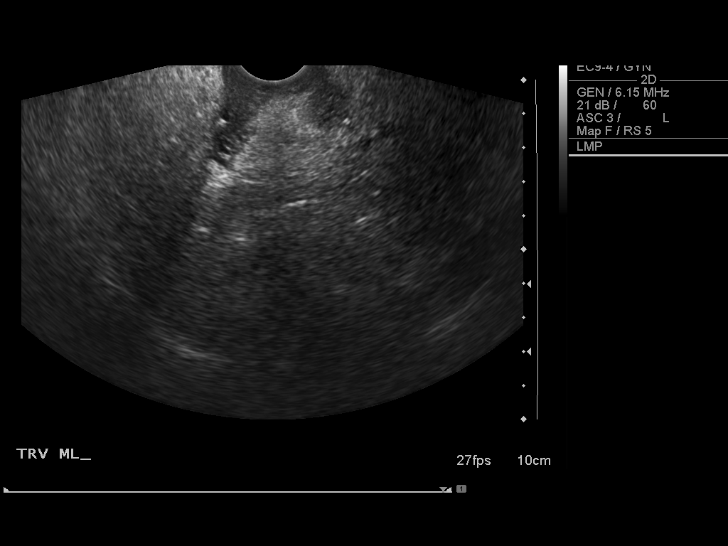
[im 68/68]
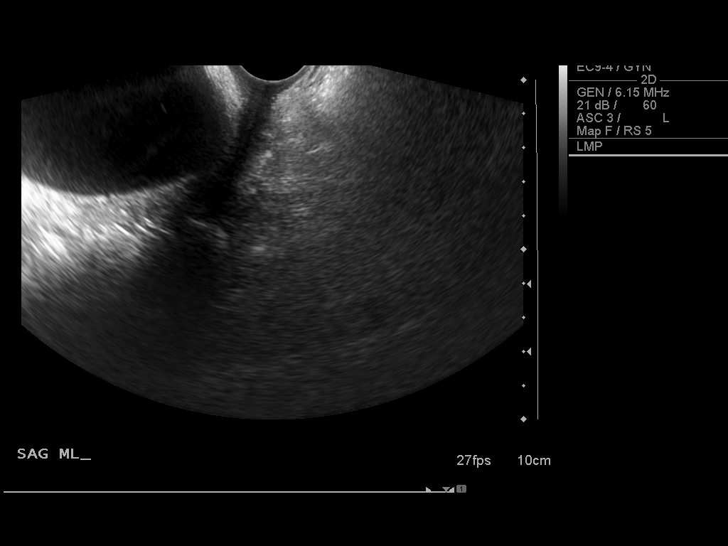

[14 of 25 positions shown; findings below may reference images not displayed]

FINDINGS: Uterus: Status post hysterectomy.  No definite abnormalities seen
at the vaginal cuff.

Endometrium: N/A

Right ovary:  Status post right-sided salpingo-oophorectomy.

Left ovary: Status post left-sided salpingo-oophorectomy.

Other findings: No free fluid is seen within the pelvic cul-de-sac.
IMPRESSION: Unremarkable pelvic ultrasound, status post hysterectomy and
bilateral salpingo-oophorectomy..

## 2014-06-12 ENCOUNTER — Encounter: Payer: Self-pay | Admitting: Internal Medicine

## 2014-08-01 LAB — MDC_IDC_ENUM_SESS_TYPE_REMOTE: Date Time Interrogation Session: 20150521040500

## 2014-08-07 ENCOUNTER — Encounter: Payer: Self-pay | Admitting: Internal Medicine

## 2014-10-07 IMAGING — CT CT CERVICAL SPINE W/O CM
4 of 6 series · 14 of 33 positions shown, 16 images · non-contrast
Comparison: None

CT HEAD

CLINICAL DATA: Syncope.  Head injury.

CT HEAD WITHOUT CONTRAST
CT CERVICAL SPINE WITHOUT CONTRAST
TECHNIQUE: Multidetector CT imaging of the head and cervical spine
was performed following the standard protocol without intravenous
contrast.  Multiplanar CT image reconstructions of the cervical
spine were also generated.

[Series 5: c-spine st · axial · 0.27mm/px · z∈[-242,-154]mm · 3 of 89 slices shown, 4 images]
[im 23/89  soft-tissue]
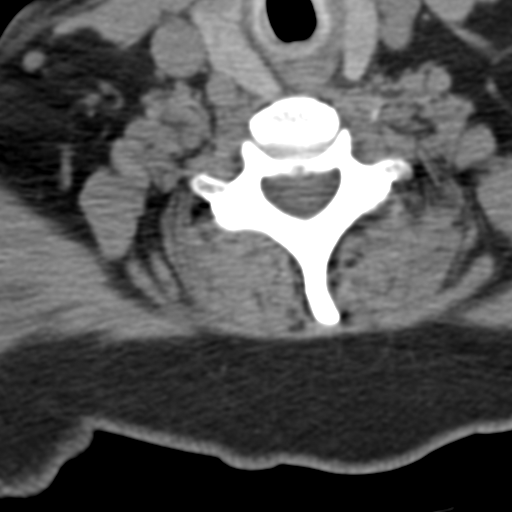
[im 23/89  bone]
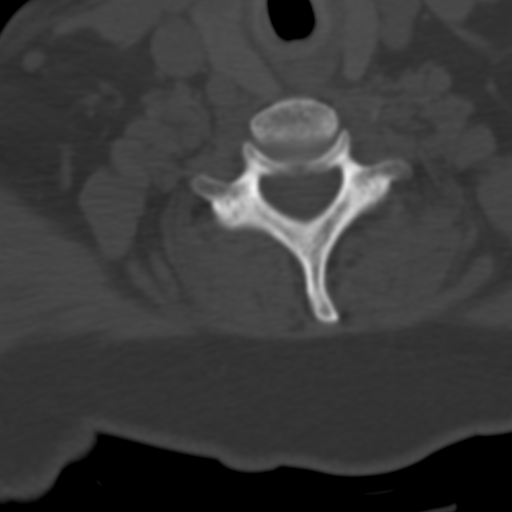
[im 45/89  bone]
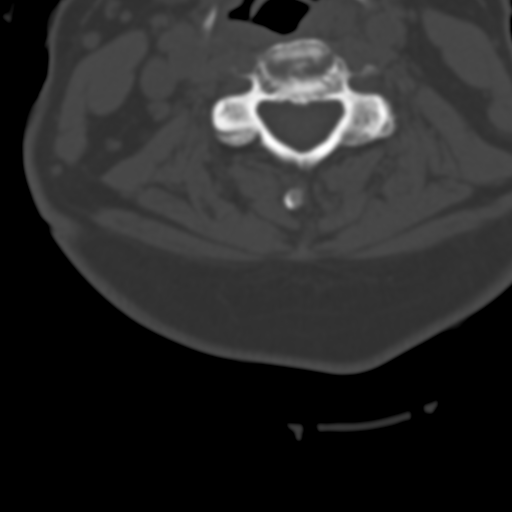
[im 67/89  bone]
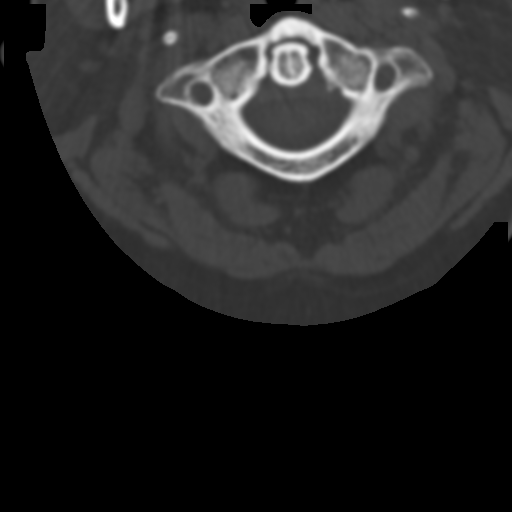

[Series 602: <mpr thick range> · coronal · 0.35mm/px · 3 of 48 slices shown]
[im 10/48  bone]
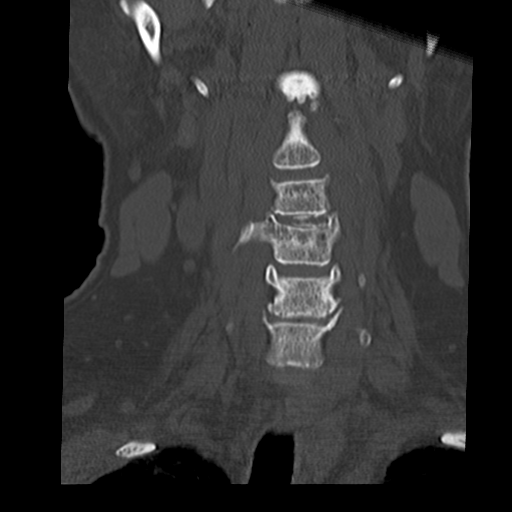
[im 19/48  bone]
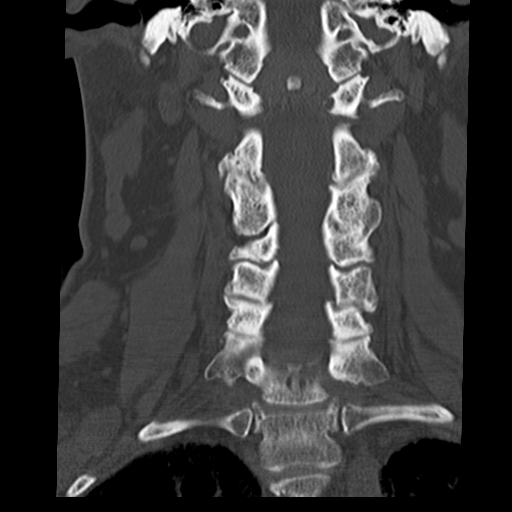
[im 29/48  bone]
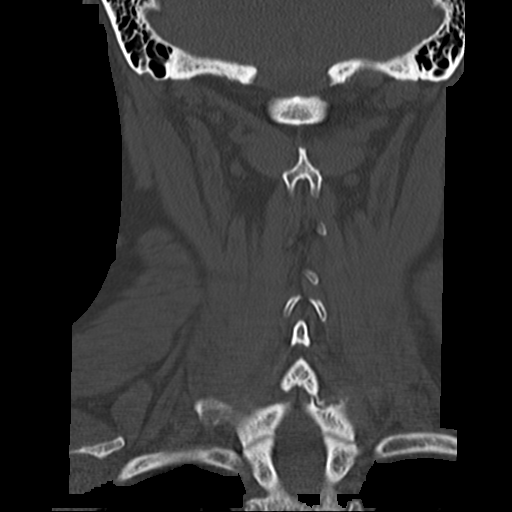

[Series 603: <mpr thick range(1)> · axial · 0.35mm/px · z∈[-250,-164]mm · 3 of 87 slices shown]
[im 22/87  bone]
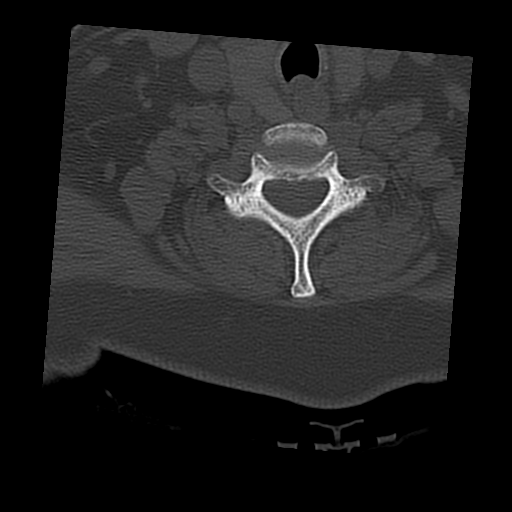
[im 44/87  bone]
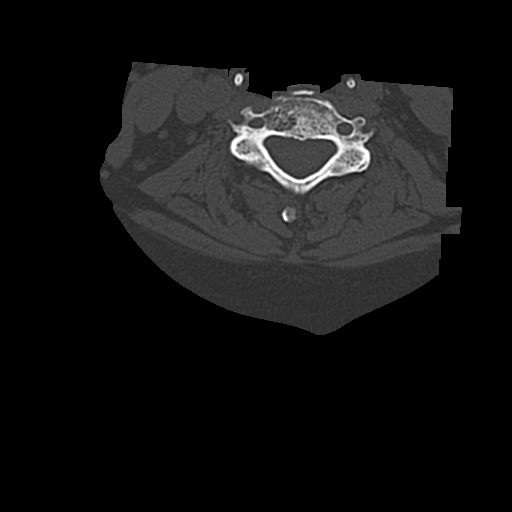
[im 65/87  bone]
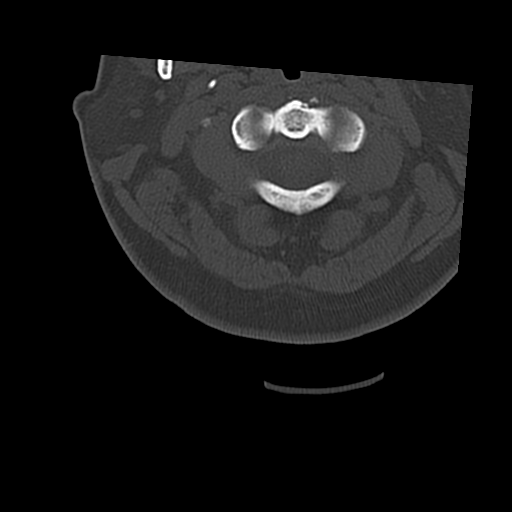

[Series 604: <mpr thick range(2)> · sagittal · 0.35mm/px · 5 of 87 slices shown, 6 images]
[im 29/87  bone]
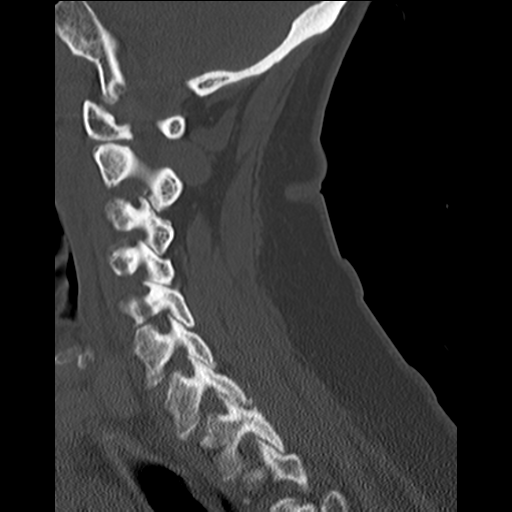
[im 36/87  bone]
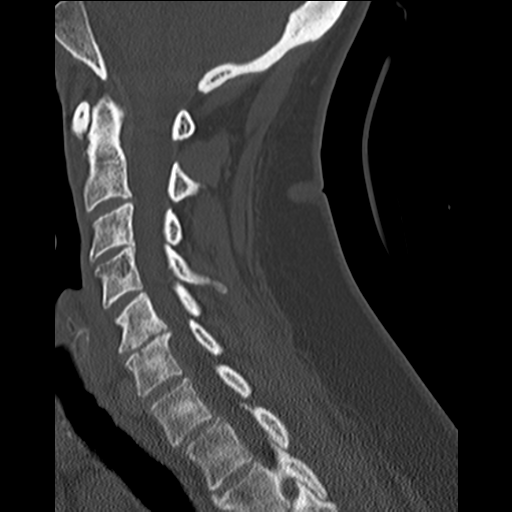
[im 44/87  soft-tissue]
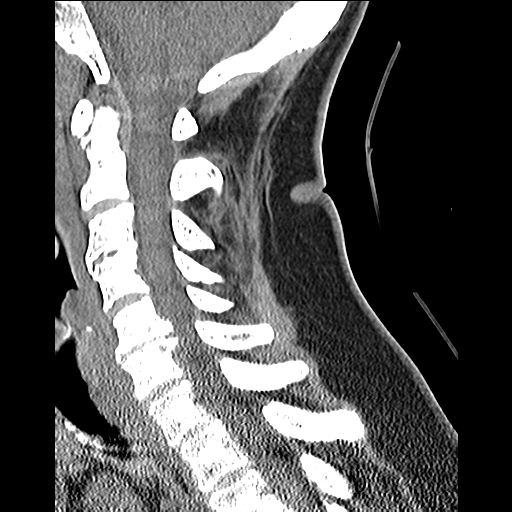
[im 44/87  bone]
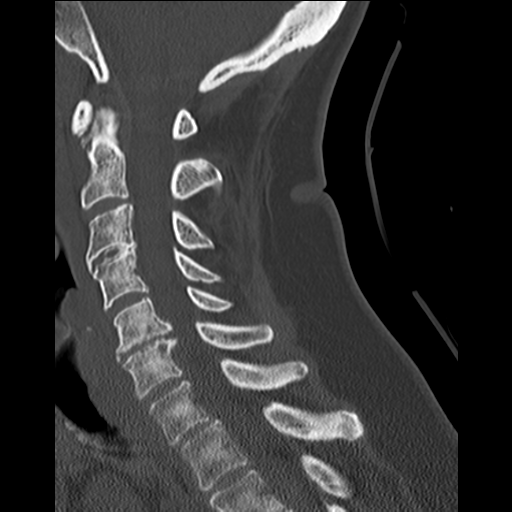
[im 51/87  bone]
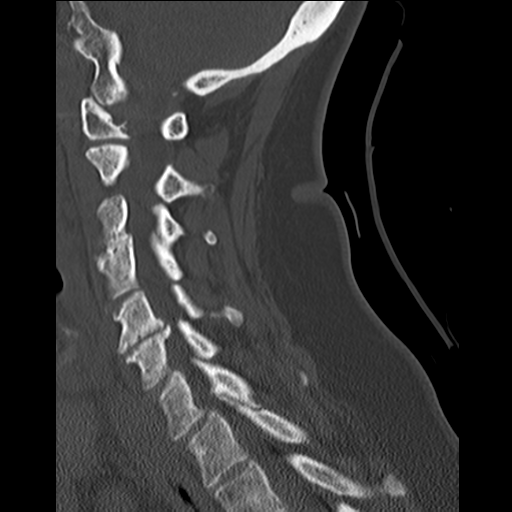
[im 58/87  bone]
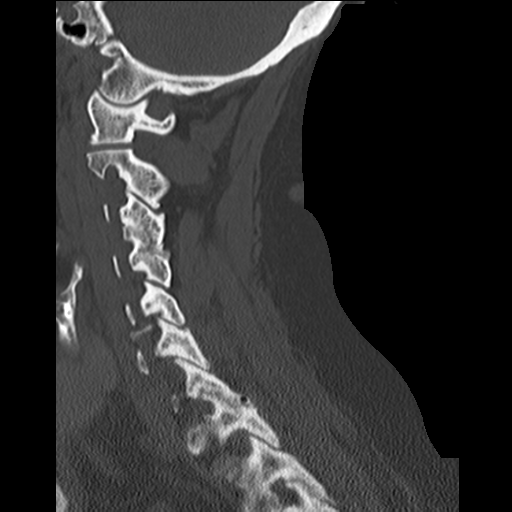

[14 of 33 positions shown; findings below may reference images not displayed]

FINDINGS: There is no mass effect, midline shift, or acute
intracranial hemorrhage.  Ventricles system and extraaxial space
are within normal limits.  There is complete opacification of the
right maxillary sinus.  There is erosion of the medial wall of the
right maxillary sinus.  Slight nasal septal deviation to the left.
There is also opacification of the anterior right ethmoid air
cells.  There is sclerosis within the right zygoma again likely
related to chronic information. No skull fracture.  Mastoid air
cells are clear.
IMPRESSION: No acute intracranial pathology.  Chronic changes in the paranasal
sinuses.

CT CERVICAL SPINE
FINDINGS: No acute fracture and no dislocation. Anatomic alignment
above C7.

C2-3:  Right-sided facet arthropathy.

C3-4:  Left articular facet ankylosis with left foraminal
narrowing.

C4-5:  Unremarkable.

C5-6:  Severe narrowing of the disc with prominent posterior
osteophytes resulting in an element of spinal stenosis.  Left
foraminal narrowing secondary to uncovertebral osteophytes.

C6-7:  Mild posterior osteophytic ridging.

C7-T1:  Minimal anterolisthesis associated with bilateral facet
arthropathy.

Unremarkable thyroid gland.
IMPRESSION: No acute bony injury in the cervical spine.  Degenerative changes
are noted.

## 2014-11-06 ENCOUNTER — Encounter (HOSPITAL_COMMUNITY): Payer: Self-pay | Admitting: Internal Medicine

## 2014-11-11 ENCOUNTER — Encounter: Payer: Self-pay | Admitting: *Deleted

## 2018-06-28 DEATH — deceased
